# Patient Record
Sex: Female | Born: 1980 | Race: White | Hispanic: No | Marital: Married | Smoking: Former smoker
Health system: Southern US, Community
[De-identification: ages and names within clinical notes are randomized; demographics above are authoritative.]

## PROBLEM LIST (undated history)

## (undated) DIAGNOSIS — C539 Malignant neoplasm of cervix uteri, unspecified: Secondary | ICD-10-CM

## (undated) DIAGNOSIS — N809 Endometriosis, unspecified: Secondary | ICD-10-CM

## (undated) DIAGNOSIS — N83209 Unspecified ovarian cyst, unspecified side: Secondary | ICD-10-CM

## (undated) HISTORY — PX: CERVICAL BIOPSY: SHX590

## (undated) HISTORY — PX: CERVICAL CONE BIOPSY: SUR198

## (undated) HISTORY — PX: TUBAL LIGATION: SHX77

---

## 2005-05-07 ENCOUNTER — Encounter: Admission: RE | Admit: 2005-05-07 | Discharge: 2005-05-07 | Payer: Self-pay | Admitting: Occupational Medicine

## 2006-06-03 ENCOUNTER — Inpatient Hospital Stay (HOSPITAL_COMMUNITY): Admission: AD | Admit: 2006-06-03 | Discharge: 2006-06-09 | Payer: Self-pay | Admitting: Psychiatry

## 2006-06-03 ENCOUNTER — Ambulatory Visit: Payer: Self-pay | Admitting: Psychiatry

## 2006-07-24 ENCOUNTER — Emergency Department (HOSPITAL_COMMUNITY): Admission: EM | Admit: 2006-07-24 | Discharge: 2006-07-24 | Payer: Self-pay | Admitting: Emergency Medicine

## 2008-01-27 ENCOUNTER — Emergency Department (HOSPITAL_COMMUNITY): Admission: EM | Admit: 2008-01-27 | Discharge: 2008-01-27 | Payer: Self-pay | Admitting: Emergency Medicine

## 2008-08-08 ENCOUNTER — Ambulatory Visit (HOSPITAL_COMMUNITY): Admission: RE | Admit: 2008-08-08 | Discharge: 2008-08-08 | Payer: Self-pay | Admitting: Unknown Physician Specialty

## 2008-08-29 ENCOUNTER — Ambulatory Visit (HOSPITAL_COMMUNITY): Admission: RE | Admit: 2008-08-29 | Discharge: 2008-08-29 | Payer: Self-pay | Admitting: Unknown Physician Specialty

## 2008-10-12 ENCOUNTER — Ambulatory Visit (HOSPITAL_COMMUNITY): Admission: RE | Admit: 2008-10-12 | Discharge: 2008-10-12 | Payer: Self-pay | Admitting: Unknown Physician Specialty

## 2008-11-09 ENCOUNTER — Ambulatory Visit (HOSPITAL_COMMUNITY): Admission: RE | Admit: 2008-11-09 | Discharge: 2008-11-09 | Payer: Self-pay | Admitting: Unknown Physician Specialty

## 2008-12-21 ENCOUNTER — Ambulatory Visit (HOSPITAL_COMMUNITY): Admission: RE | Admit: 2008-12-21 | Discharge: 2008-12-21 | Payer: Self-pay | Admitting: Unknown Physician Specialty

## 2010-05-02 ENCOUNTER — Inpatient Hospital Stay: Payer: Self-pay | Admitting: Psychiatry

## 2010-07-29 ENCOUNTER — Emergency Department (HOSPITAL_COMMUNITY): Admission: EM | Admit: 2010-07-29 | Discharge: 2010-07-30 | Payer: Self-pay | Admitting: Emergency Medicine

## 2010-12-03 LAB — URINE CULTURE: Culture  Setup Time: 201111082126

## 2010-12-03 LAB — BASIC METABOLIC PANEL
BUN: 8 mg/dL (ref 6–23)
Chloride: 102 mEq/L (ref 96–112)
GFR calc Af Amer: >60 mL/min (ref >60)
GFR calc non Af Amer: >60 mL/min (ref >60)
Potassium: 4.3 mEq/L (ref 3.5–5.1)
Sodium: 138 mEq/L (ref 135–145)

## 2010-12-03 LAB — CBC
HCT: 43.2 % (ref 36.0–46.0)
Hemoglobin: 14.7 g/dL (ref 12.0–15.0)
MCV: 87.5 fL (ref 78.0–100.0)
RBC: 4.94 MIL/uL (ref 3.87–5.11)
WBC: 8 10*3/uL (ref 4.0–10.5)

## 2010-12-03 LAB — HEPATIC FUNCTION PANEL
ALT: 21 U/L (ref 0–35)
AST: 27 U/L (ref 0–37)
Alkaline Phosphatase: 72 U/L (ref 39–117)
Bilirubin, Direct: 0.2 mg/dL (ref 0.0–0.3)
Indirect Bilirubin: 0.7 mg/dL (ref 0.3–0.9)

## 2010-12-03 LAB — RAPID URINE DRUG SCREEN, HOSP PERFORMED
Amphetamines: NOT DETECTED
Benzodiazepines: POSITIVE — AB
Opiates: POSITIVE — AB
Tetrahydrocannabinol: POSITIVE — AB

## 2010-12-03 LAB — DIFFERENTIAL
Basophils Absolute: 0 10*3/uL (ref 0.0–0.1)
Eosinophils Relative: 3 % (ref 0–5)
Lymphocytes Relative: 24 % (ref 12–46)
Lymphs Abs: 1.9 10*3/uL (ref 0.7–4.0)
Monocytes Absolute: 0.5 10*3/uL (ref 0.1–1.0)
Neutro Abs: 5.3 10*3/uL (ref 1.7–7.7)

## 2010-12-03 LAB — URINALYSIS, ROUTINE W REFLEX MICROSCOPIC
Nitrite: NEGATIVE
Protein, ur: NEGATIVE mg/dL
Specific Gravity, Urine: >1.03 — ABNORMAL HIGH (ref 1.005–1.030)
Urobilinogen, UA: 2 mg/dL — ABNORMAL HIGH (ref 0.0–1.0)

## 2010-12-03 LAB — PREGNANCY, URINE: Preg Test, Ur: NEGATIVE

## 2010-12-03 LAB — URINE MICROSCOPIC-ADD ON

## 2010-12-03 LAB — ETHANOL: Alcohol, Ethyl (B): <5 mg/dL (ref 0–10)

## 2011-02-07 NOTE — Discharge Summary (Addendum)
NAME:  Jenny Garrison, Jenny Garrison            ACCOUNT NO.:  [id number]   MEDICAL RECORD NO.:  [id number]          PATIENT TYPE:  IPS   LOCATION:  0301                          FACILITY:  [facility]   PHYSICIAN:  Geoffery Lyons, M.D.      DATE OF BIRTH:  [birth year]   DATE OF ADMISSION:  06/03/2006  DATE OF DISCHARGE:  06/09/2006                                 DISCHARGE SUMMARY   CHIEF COMPLAINT AND HISTORY OF PRESENT ILLNESS:  It was the first admission  to [facility] for this 30 year old married female.  Petitioned by her physician at [facility].  She was admitted there  secondary May 31, 2006, because of an overdose of 8 tablets of Xanax,  which she had been taking off the street.  Also, she drank some alcohol.  She passed out and was found in the car by the police.  She had gotten some  type of traffic ticket prior to passing out.  Admitted to the ICU, had some  agitation, she received some Haldol.  Admitted taking Xanax recreationally  on the weekend, when she has been drinking.  She had similar episode six  months prior to this admission.  Admitted that she recognized that she was  using drugs and alcohol inappropriately, wanted to stay clean.   PAST PSYCHIATRIC HISTORY:  First time at [facility].  Similar episode  six months ago.  Alcohol and Xanax __________.   __________ HISTORY:  As already stated, episodes of abusing alcohol and  Xanax with episode of blackouts.   MEDICAL HISTORY:  Noncontributory.   MEDICATIONS:  1. Effexor XR 75 mg per day, has been taking for several weeks.  2. Taking Xanax 1 mg off the street.   ____ QA MARKER 151 ____PERFOR   LABORATORY DATA:  Urine pregnancy test negative.  UDS positive for  benzodiazepines.  CBC within normal limits.  Electrolytes unremarkable.  Liver enzymes within normal limits.  TSH 1.08.   PHYSICAL EXAMINATION:  GENERAL:  Reveals a fully alert female, pleasant,  cooperative, well groomed, bright affect.   Speech was normal in pace and  tone and production, pretty fluent, articulate.  MOOD:  Somewhat anxious about the future.  Endorsed being embarrassed and  ashamed of her actions.  Endorsed that she understood what she was doing was  unsafe.  She [facility] as she [facility] very well [facility].  __________  able  to maintain sobriety on the Effexor.    AXIS I:  Alcohol abuse, benzodiazepine abuse, depressive disorder, not  otherwise specified.   AXIS II:  No diagnosis.   AXIS III:  No diagnosis.   AXIS IV:  Moderate.   AXIS V:  Per admission 30, highest GAF in the last year 65-70.   HOSPITAL COURSE:  She was admitted.  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  She was maintained on the Effexor, given Ambien.  She was  given Cipro and Flagyl.  She endorsed that she was under a lot of stress  raising her 2 children as a single parent.  She works.  The father  of her  children is in jail, will be released in a few days.  He has been in and  out.  He, according to her report, does alcohol, drugs, and he has been in  jail due to drug-related crimes.  When he is not using drugs, she claims he  really is a good father, good person.  On drugs, he is unpredictable.  She,  herself, admits that she parties on the weekends using Xanax that she gets  from friends.  She got high.  She endorsed that she might have taken close  to twenty 1 mg Xanax.  She uses Effexor up to 75 mg successfully and was  wanting to go back on it.  We went ahead and pursued some detox.  We got the  Effexor back in place.  She was having a hard time with her mood, very  anxious.  Sleep was a major concern, did not sleep too well.  Hard time  taking p.o. due to having big tonsils.  She opens up the capsules or bites  the tablets.  He was thinking of the boyfriend was going to be out of jail  he but did not.  We added Remeron Soltab 30 mg at bedtime.  We continued to  her with the Effexor and the Remeron.   June 08, 2006, she was sleeping  better.  Boyfriend did not come out of jail as she expected, had to go right  back due to other charges.  She herself said she was better, committed to  abstinence.  She will not go back with her boyfriend.  She said she could  not continue to live with his substance use.  June 09, 2006, she was in  full contact with reality, committed to abstinence.  No suicidal, no  homicidal idea.  No hallucination.  No delusions.  Boyfriend was finally out  of jail but she was not planning to go back with him.   DISCHARGE DIAGNOSES:   AXIS I:  1. Depressive disorder, not otherwise specified.  2. Benzodiazepine and alcohol abuse.   AXIS II:  No diagnosis.   AXIS III:  No diagnosis.   AXIS IV:  Moderate.   AXIS V:  Upon discharge 55-60.   Discharged on:  1. Flagyl 250 twice a day for seven days.  2. Remeron Soltab 30 mg at bedtime.  3. Effexor XR 150 mg per day.  4. Protonix 40 mg per day.   Follow with [facility].      Geoffery Lyons, M.D.  Electronically Signed    IL/MEDQ  D:  06/29/2006  T:  07/01/2006  Job:  [id number]

## 2011-02-07 NOTE — H&P (Signed)
NAME:  Jenny Garrison, Jenny Garrison            ACCOUNT NO.:  [id number]   MEDICAL RECORD NO.:  [id number]          PATIENT TYPE:  IPS   LOCATION:  0301                          FACILITY:  [facility]   PHYSICIAN:  Geoffery Lyons, M.D.      DATE OF BIRTH:  Mar 09, 1981   DATE OF ADMISSION:  06/03/2006  DATE OF DISCHARGE:                         PSYCHIATRIC ADMISSION ASSESSMENT   IDENTIFYING INFORMATION:  This is a 30 year old married female.  This is an  involuntary admission.   HISTORY OF PRESENT ILLNESS:  This patient was petitioned by her physician at  [facility] after she was admitted there on May 31, 2006  after taking an overdose of approximately 8 tablets of Xanax which she has  been taking off the street.  She had also drunk some alcohol.  She passed  out and was fond in the car by police and apparently had gotten some type of  traffic ticket prior to passing out.  There was concern that it was an  intentional overdose.  The patient was admitted to the ICU where she was  stabilized.  She did not require ventilator support, did have some episodes  of agitation which were well-controlled with some Haldol.  The patient is a  68 year old mother of two small children who reports she has been using  Xanax recreationally on the weekend when she has been drinking, taking it  when she socializes with her friends because it makes her feel good.  She  had a similar episode of passing out and not remembering the circumstances  and was hospitalized at [facility] approximately six months ago.  Today, she denies any suicidal thought, is ashamed of her behavior,  recognizes that she is using drugs and alcohol inappropriately and expresses  her interest in remaining clean and sober and avoiding this.  Says that she  did very well attending Narcotics Anonymous meetings for about a month and  was completely clean and sober, then became stressed after returning to work  and feeling stressed  beyond her limits between the job and care of two small  children.  Husband is currently in prison and has a history of some  substance use also.  The patient denies any suicidal or homicidal thoughts.  Denies any IV drug abuse.   PAST PSYCHIATRIC HISTORY:  This is the patient's first admission to [facility].  As noted, she had previous episode of what  she says was essentially identical, passing out on Xanax and alcohol after  which she was hospitalized at [facility] about six months  ago.  Has attended Narcotics Anonymous and feels that this was good for her  and would like to return there.  Denies prior suicide attempts.  Denies IV  drug abuse.  Denies other substance use.  Denies history of mania.  The  patient was prescribed previously Effexor and was taking about 75 mg a day.  This did help her while she was attending NA, then she stopped taking it but  she is not sure why.  Did feel that it was helpful.   SOCIAL HISTORY:  Married female with 75 and 54-year-old sons.  Husband  currently in jail, doing 60 days for breaking and entering and has also had  some substance use problems.  Mother is supportive.  She is currently living  with her mother who is caring for the children while she is hospitalized.  No current legal charges other than traffic tickets.   FAMILY HISTORY:  Father and paternal grandfather with history of alcoholism.   MEDICAL HISTORY:  Current medical problems are status post amnestic episode  with blackout, possible drug overdose on alprazolam.  No other medical  problems.  Healthy female, 30 years old.   CURRENT MEDICATIONS:  Effexor XR 75 mg which she has lapsed taking for a  couple of weeks at least.  Did receive some low dose Haldol in the ICU for  episodes of agitation.  She is also on Flagyl 500 mg p.o. b.i.d. for vaginal  infection secondary to IUD placement and some vaginal discharge that she had  had.  Also taking  Xanax 1 mg off the street.   ALLERGIES:  No known drug allergies.   PHYSICAL EXAMINATION:  The patient's full physical examination is in the  record.  Done at [facility].  On admission to our unit, she is 5 feet  1 inch tall, 132 pounds, temperature 98, pulse 86, respirations 16, blood  pressure 150/74, O2 sat was 100%.  Well-nourished, well-developed female who  is in no acute distress, well-groomed.   LABORATORY DATA:  At the time of admission to [facility] revealed blood  alcohol less than 5, salicylate less than 4, acetaminophen level was less  than 10.  Her urine pregnancy test was negative.  Urine drug screen was  positive for benzodiazepines, negative for all other substances.  Routine  urinalysis revealed large amount of bacteria, 0-5 white cells.  CBC was all  within normal limits and electrolytes were unremarkable.  Liver enzymes were  within normal limits and TSH was 1.08.   MENTAL STATUS EXAM:  Fully alert female.  Pleasant, cooperative.  Very well-  groomed.  Bright affect.  Speech is normal in pace and tone and production,  fluent, articulate.  Mood somewhat anxious about the future.  Also  embarrassed and ashamed of her actions.  Knows that what she is doing is  unsafe and voices her commitment to stay away from drugs and alcohol.  Realizes that she did very well at [facility] and plans to go back there.  Thinks  that that will be a good support mechanism for her.  Also knows that she did  better and was able to maintain sobriety on the Effexor.  Feels that that  would help her again.  Thought processes are logical, coherent, goal  directed.  No suicidal thoughts today.  No evidence of homicidal thoughts or  psychosis.  No flight of ideas or paranoia.  Insight satisfactory.  Cognitively, she is intact and oriented x3.  Calculation and concentration  are intact.  Impulse control and judgment within normal limits.  DIAGNOSES:  AXIS I:  Benzodiazepine abuse; rule out  dependence.  ETOH abuse;  rule out dependence.  Depressive disorder not otherwise specified.  AXIS II:  No diagnosis.  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  Benzodiazepine  overdose.  AXIS IV:  Moderate (marital stress).  AXIS V:  Current 30; past year 40.   PLAN:  To involuntarily admit the patient for safe detox from alcohol and  benzodiazepines with the goal of a  safe detox in five days.  We started her  on a Librium protocol and she has clearly expressed some depression and  anxiety with parenting and the issues at home.  We are going to restart her  Effexor at 37.5 mg daily and progress from there.  Will continue her routine  medications and we have enrolled her in dual-diagnosis programming and we  hope to get a meeting with her husband if possible this week.   ESTIMATED LENGTH OF STAY:  Five days.     Margaret A. Scott, N.P.      Geoffery Lyons, M.D.  Electronically Signed   MAS/MEDQ  D:  06/04/2006  T:  06/04/2006  Job:  [id number]

## 2012-02-19 ENCOUNTER — Emergency Department (HOSPITAL_COMMUNITY)
Admission: EM | Admit: 2012-02-19 | Discharge: 2012-02-19 | Disposition: A | Discharge status: Home / Self Care | Payer: Self-pay | Attending: Emergency Medicine | Admitting: Emergency Medicine

## 2012-02-19 ENCOUNTER — Encounter (HOSPITAL_COMMUNITY): Payer: Self-pay | Admitting: *Deleted

## 2012-02-19 DIAGNOSIS — R102 Pelvic and perineal pain: Secondary | ICD-10-CM

## 2012-02-19 DIAGNOSIS — B9689 Other specified bacterial agents as the cause of diseases classified elsewhere: Secondary | ICD-10-CM | POA: Insufficient documentation

## 2012-02-19 DIAGNOSIS — F17200 Nicotine dependence, unspecified, uncomplicated: Secondary | ICD-10-CM | POA: Insufficient documentation

## 2012-02-19 DIAGNOSIS — N9489 Other specified conditions associated with female genital organs and menstrual cycle: Secondary | ICD-10-CM | POA: Insufficient documentation

## 2012-02-19 DIAGNOSIS — N760 Acute vaginitis: Secondary | ICD-10-CM | POA: Insufficient documentation

## 2012-02-19 DIAGNOSIS — A499 Bacterial infection, unspecified: Secondary | ICD-10-CM | POA: Insufficient documentation

## 2012-02-19 HISTORY — DX: Malignant neoplasm of cervix uteri, unspecified: C53.9

## 2012-02-19 HISTORY — DX: Unspecified ovarian cyst, unspecified side: N83.209

## 2012-02-19 LAB — PREGNANCY, URINE: Preg Test, Ur: NEGATIVE

## 2012-02-19 LAB — URINALYSIS, ROUTINE W REFLEX MICROSCOPIC
Bilirubin Urine: NEGATIVE
Hgb urine dipstick: NEGATIVE
Ketones, ur: NEGATIVE mg/dL
Nitrite: NEGATIVE
Specific Gravity, Urine: 1.015 (ref 1.005–1.030)
Urobilinogen, UA: 1 mg/dL (ref 0.0–1.0)
pH: 8.5 — ABNORMAL HIGH (ref 5.0–8.0)

## 2012-02-19 LAB — WET PREP, GENITAL: Yeast Wet Prep HPF POC: NONE SEEN

## 2012-02-19 LAB — POCT PREGNANCY, URINE: Preg Test, Ur: NEGATIVE

## 2012-02-19 MED ORDER — METRONIDAZOLE 500 MG PO TABS
2000.0000 mg | ORAL_TABLET | Freq: Once | ORAL | Status: AC
  Administered 2012-02-19: 2000 mg via ORAL
  Filled 2012-02-19: qty 4

## 2012-02-19 MED ORDER — NAPROXEN 500 MG PO TABS: 500.0000 mg | ORAL_TABLET | Freq: Two times a day (BID) | ORAL | Status: AC

## 2012-02-19 MED ORDER — HYDROMORPHONE HCL PF 1 MG/ML IJ SOLN
1.0000 mg | Freq: Once | INTRAMUSCULAR | Status: AC
  Administered 2012-02-19: 1 mg via INTRAMUSCULAR
  Filled 2012-02-19: qty 1

## 2012-02-19 MED ORDER — KETOROLAC TROMETHAMINE 60 MG/2ML IM SOLN
60.0000 mg | Freq: Once | INTRAMUSCULAR | Status: AC
  Administered 2012-02-19: 60 mg via INTRAMUSCULAR
  Filled 2012-02-19: qty 2

## 2012-02-19 MED ORDER — HYDROCODONE-ACETAMINOPHEN 5-325 MG PO TABS: 2.0000 | ORAL_TABLET | ORAL | Status: AC | PRN

## 2012-02-19 NOTE — ED Provider Notes (Signed)
History    This chart was scribed for Jenny Octave, MD, MD by Smitty Pluck. The patient was seen in room APA12 and the patient's care was started at 9:03PM.   CSN: [id number]  Arrival date & time 02/19/12  4098   First MD Initiated Contact with Patient 02/19/12 2101      Chief Complaint  Patient presents with  . Pelvic Pain  . Dysuria  . Urinary Frequency    (Consider location/radiation/quality/duration/timing/severity/associated sxs/prior treatment) The history is provided by the patient.   Jenny Garrison is a 31 y.o. female who presents to the Emergency Department complaining of moderate dysuria and urinary frequency onset today. Denies hematuria, fever, Pt reports having chronic pelvic pain due to having cyst on ovaries. She reports history of cervical cancer. Reports that her period is abnormal and the last one she had was 1.5 months ago. Symptoms have been constant since onset with radiation to lower back.   Past Medical History  Diagnosis Date  . Other and unspecified ovarian cysts rt side  . CA cervix     Past Surgical History  Procedure Date  . Tubal ligation     History reviewed. No pertinent family history.  History  Substance Use Topics  . Smoking status: Current Everyday Smoker -- 0.5 packs/day for 13 years    Types: Cigarettes  . Smokeless tobacco: Not on file  . Alcohol Use: No    OB History    Grav Para Term Preterm Abortions TAB SAB Ect Mult Living                  Review of Systems  All other systems reviewed and are negative.  10 Systems reviewed and all are negative for acute change except as noted in the HPI.    Allergies  Review of patient's allergies indicates no known allergies.  Home Medications   Current Outpatient Rx  Name Route Sig Dispense Refill  . IBUPROFEN 200 MG PO TABS Oral Take 200 mg by mouth every 6 (six) hours as needed. For pain    . HYDROCODONE-ACETAMINOPHEN 5-325 MG PO TABS Oral Take 2 tablets by mouth every 4  (four) hours as needed for pain. 10 tablet 0  . NAPROXEN 500 MG PO TABS Oral Take 1 tablet (500 mg total) by mouth 2 (two) times daily. 30 tablet 0    BP 114/61  Pulse 75  Temp(Src) 98.1 F (36.7 C) (Oral)  Resp 20  Ht 5\' 2"  (1.575 m)  Wt 140 lb (63.504 kg)  BMI 25.61 kg/m2  SpO2 97%  Physical Exam  Nursing note and vitals reviewed. Constitutional: She is oriented to person, place, and time. She appears well-developed and well-nourished.  HENT:  Head: Normocephalic and atraumatic.  Neck: Normal range of motion. Neck supple.  Cardiovascular: Normal rate, regular rhythm and normal heart sounds.   Pulmonary/Chest: Effort normal and breath sounds normal. No respiratory distress.  Abdominal: There is tenderness (mild LLQ, RLQ, suprapubic). There is no rebound and no guarding.  Genitourinary: There is no tenderness on the right labia. There is no tenderness on the left labia. Cervix exhibits no motion tenderness and no discharge. Right adnexum displays tenderness. Left adnexum displays tenderness. No vaginal discharge found.  Neurological: She is alert and oriented to person, place, and time.  Skin: Skin is warm and dry.  Psychiatric: She has a normal mood and affect. Her behavior is normal.    ED Course  Procedures (including critical care time) DIAGNOSTIC STUDIES:  Oxygen Saturation is 100% on room air, normal by my interpretation.    COORDINATION OF CARE: 9:05PM EDP discusses pt ED treatment with pt.  9:24PM EDP orders medication: toradol 60 mg, dilaudid 1 mg   Labs Reviewed  URINALYSIS, ROUTINE W REFLEX MICROSCOPIC - Abnormal; Notable for the following:    APPearance CLOUDY (*)    pH 8.5 (*)    All other components within normal limits  WET PREP, GENITAL - Abnormal; Notable for the following:    Clue Cells Wet Prep HPF POC FEW (*)    WBC, Wet Prep HPF POC FEW (*)    All other components within normal limits  PREGNANCY, URINE  POCT PREGNANCY, URINE  GC/CHLAMYDIA PROBE  AMP, GENITAL   No results found.   1. Pelvic pain   2. Bacterial vaginosis       MDM  History of ovarian cysts, chronic pelvic pain which is unchanged, now with burning with urination and pressure.  No vaginal bleeding or discharge.  Has chronic pelvic pain managed by her GYN in [place].  Pelvic exam benign.  Treat BV.  Short course of NSAIDs and pain medication.  Follow up with GYN.  I personally performed the services described in this documentation, which was scribed in my presence.  The recorded information has been reviewed and considered.        Jenny Octave, MD 02/19/12 [id number]

## 2012-02-19 NOTE — Discharge Instructions (Signed)
Bacterial Vaginosis Bacterial vaginosis (BV) is a vaginal infection where the normal balance of bacteria in the vagina is disrupted. The normal balance is then replaced by an overgrowth of certain bacteria. There are several different kinds of bacteria that can cause BV. BV is the most common vaginal infection in women of childbearing age. CAUSES   The cause of BV is not fully understood. BV develops when there is an increase or imbalance of harmful bacteria.   Some activities or behaviors can upset the normal balance of bacteria in the vagina and put women at increased risk including:   Having a new sex partner or multiple sex partners.   Douching.   Using an intrauterine device (IUD) for contraception.   It is not clear what role sexual activity plays in the development of BV. However, women that have never had sexual intercourse are rarely infected with BV.  Women do not get BV from toilet seats, bedding, swimming pools or from touching objects around them.  SYMPTOMS   Grey vaginal discharge.   A fish-like odor with discharge, especially after sexual intercourse.   Itching or burning of the vagina and vulva.   Burning or pain with urination.   Some women have no signs or symptoms at all.  DIAGNOSIS  Your caregiver must examine the vagina for signs of BV. Your caregiver will perform lab tests and look at the sample of vaginal fluid through a microscope. They will look for bacteria and abnormal cells (clue cells), a pH test higher than 4.5, and a positive amine test all associated with BV.  RISKS AND COMPLICATIONS   Pelvic inflammatory disease (PID).   Infections following gynecology surgery.   Developing HIV.   Developing herpes virus.  TREATMENT  Sometimes BV will clear up without treatment. However, all women with symptoms of BV should be treated to avoid complications, especially if gynecology surgery is planned. Female partners generally do not need to be treated. However,  BV may spread between female sex partners so treatment is helpful in preventing a recurrence of BV.   BV may be treated with antibiotics. The antibiotics come in either pill or vaginal cream forms. Either can be used with nonpregnant or pregnant women, but the recommended dosages differ. These antibiotics are not harmful to the baby.   BV can recur after treatment. If this happens, a second round of antibiotics will often be prescribed.   Treatment is important for pregnant women. If not treated, BV can cause a premature delivery, especially for a pregnant woman who had a premature birth in the past. All pregnant women who have symptoms of BV should be checked and treated.   For chronic reoccurrence of BV, treatment with a type of prescribed gel vaginally twice a week is helpful.  HOME CARE INSTRUCTIONS   Finish all medication as directed by your caregiver.   Do not have sex until treatment is completed.   Tell your sexual partner that you have a vaginal infection. They should see their caregiver and be treated if they have problems, such as a mild rash or itching.   Practice safe sex. Use condoms. Only have 1 sex partner.  PREVENTION  Basic prevention steps can help reduce the risk of upsetting the natural balance of bacteria in the vagina and developing BV:  Do not have sexual intercourse (be abstinent).   Do not douche.   Use all of the medicine prescribed for treatment of BV, even if the signs and symptoms go away.     Tell your sex partner if you have BV. That way, they can be treated, if needed, to prevent reoccurrence.  SEEK MEDICAL CARE IF:   Your symptoms are not improving after 3 days of treatment.   You have increased discharge, pain, or fever.  MAKE SURE YOU:   Understand these instructions.   Will watch your condition.   Will get help right away if you are not doing well or get worse.  FOR MORE INFORMATION  Division of STD Prevention (DSTDP), Centers for Disease  Control and Prevention: [employer]Apps.[employer] American Social Health Association (ASHA): www.ashastd.org  Document Released: 09/08/2005 Document Revised: 08/28/2011 Document Reviewed: 03/01/2009 [facility] Patient Information 2012 [place], [place].Pelvic Pain Pelvic pain is pain below the belly button and located between your hips. Acute pain may last a few hours or days. Chronic pelvic pain may last weeks and months. The cause may be different for different types of pain. The pain may be dull or sharp, mild or severe and can interfere with your daily activities. Write down and tell your caregiver:   Exactly where the pain is located.   If it comes and goes or is there all the time.   When it happens (with sex, urination, bowel movement, etc.)   If the pain is related to your menstrual period or stress.  Your caregiver will take a full history and do a complete physical exam and Pap test. CAUSES   Painful menstrual periods (dysmenorrhea).   Normal ovulation (Mittelschmertz) that occurs in the middle of the menstrual cycle every month.   The pelvic organs get engorged with blood just before the menstrual period (pelvic congestive syndrome).   Scar tissue from an infection or past surgery (pelvic adhesions).   Cancer of the female pelvic organs. When there is pain with cancer, it has been there for a long time.   The lining of the uterus (endometrium) abnormally grows in places like the pelvis and on the pelvic organs (endometriosis).   A form of endometriosis with the lining of the uterus present inside of the muscle tissue of the uterus (adenomyosis).   Fibroid tumor (noncancerous) in the uterus.   Bladder problems such as infection, bladder spasms of the muscle tissue of the bladder.   Intestinal problems (irritable bowel syndrome, colitis, an ulcer or gastrointestinal infection).   Polyps of the cervix or uterus.   Pregnancy in the tube (ectopic pregnancy).   The opening of the  cervix is too small for the menstrual blood to flow through it (cervical stenosis).   Physical or sexual abuse (past or present).   Musculo-skeletal problems from poor posture, problems with the vertebrae of the lower back or the uterine pelvic muscles falling (prolapse).   Psychological problems such as depression or stress.   IUD (intrauterine device) in the uterus.  DIAGNOSIS  Tests to make a diagnosis depends on the type, location, severity and what causes the pain to occur. Tests that may be needed include:  Blood tests.   Urine tests   Ultrasound.   X-rays.   CT Scan.   MRI.   Laparoscopy.   Major surgery.  TREATMENT  Treatment will depend on the cause of the pain, which includes:  Prescription or over-the-counter pain medication.   Antibiotics.   Birth control pills.   Hormone treatment.   Nerve blocking injections.   Physical therapy.   Antidepressants.   Counseling with a psychiatrist or psychologist.   Minor or major surgery.  HOME CARE INSTRUCTIONS   Only  take over-the-counter or prescription medicines for pain, discomfort or fever as directed by your caregiver.   Follow your caregiver's advice to treat your pain.   Rest.   Avoid sexual intercourse if it causes the pain.   Apply warm or cold compresses (which ever works best) to the pain area.   Do relaxation exercises such as yoga or meditation.   Try acupuncture.   Avoid stressful situations.   Try group therapy.   If the pain is because of a stomach/intestinal upset, drink clear liquids, eat a bland light food diet until the symptoms go away.  SEEK MEDICAL CARE IF:   You need stronger prescription pain medication.   You develop pain with sexual intercourse.   You have pain with urination.   You develop a temperature of 102 F (38.9 C) with the pain.   You are still in pain after 4 hours of taking prescription medication for the pain.   You need depression medication.    Your IUD is causing pain and you want it removed.  SEEK IMMEDIATE MEDICAL CARE IF:  You develop very severe pain or tenderness.   You faint, have chills, severe weakness or dehydration.   You develop heavy vaginal bleeding or passing solid tissue.   You develop a temperature of 102 F (38.9 C) with the pain.   You have blood in the urine.   You are being physically or sexually abused.   You have uncontrolled vomiting and diarrhea.   You are depressed and afraid of harming yourself or someone else.  Document Released: 10/16/2004 Document Revised: 08/28/2011 Document Reviewed: 07/13/2008 [facility] Patient Information 2012 [place], [place].

## 2012-02-19 NOTE — ED Notes (Signed)
Pt has HX of ovarian cysts, cervical cancer - Pt states the pain "feels like it is in my ovaries". Pt also stated having frequent painful urination that just started along with the pelvic pain".

## 2012-02-21 LAB — GC/CHLAMYDIA PROBE AMP, GENITAL: Chlamydia, DNA Probe: NEGATIVE

## 2012-05-31 ENCOUNTER — Ambulatory Visit (HOSPITAL_COMMUNITY): Payer: Self-pay | Admitting: Physical Therapy

## 2013-02-24 ENCOUNTER — Emergency Department (HOSPITAL_COMMUNITY)
Admission: EM | Admit: 2013-02-24 | Discharge: 2013-02-24 | Disposition: A | Discharge status: Home / Self Care | Payer: Self-pay | Attending: Emergency Medicine | Admitting: Emergency Medicine

## 2013-02-24 ENCOUNTER — Encounter (HOSPITAL_COMMUNITY): Payer: Self-pay | Admitting: Emergency Medicine

## 2013-02-24 DIAGNOSIS — K089 Disorder of teeth and supporting structures, unspecified: Secondary | ICD-10-CM | POA: Insufficient documentation

## 2013-02-24 DIAGNOSIS — R51 Headache: Secondary | ICD-10-CM | POA: Insufficient documentation

## 2013-02-24 DIAGNOSIS — R220 Localized swelling, mass and lump, head: Secondary | ICD-10-CM | POA: Insufficient documentation

## 2013-02-24 DIAGNOSIS — Z8742 Personal history of other diseases of the female genital tract: Secondary | ICD-10-CM | POA: Insufficient documentation

## 2013-02-24 DIAGNOSIS — Z8541 Personal history of malignant neoplasm of cervix uteri: Secondary | ICD-10-CM | POA: Insufficient documentation

## 2013-02-24 DIAGNOSIS — F17200 Nicotine dependence, unspecified, uncomplicated: Secondary | ICD-10-CM | POA: Insufficient documentation

## 2013-02-24 DIAGNOSIS — K0889 Other specified disorders of teeth and supporting structures: Secondary | ICD-10-CM

## 2013-02-24 HISTORY — DX: Endometriosis, unspecified: N80.9

## 2013-02-24 MED ORDER — AMOXICILLIN 500 MG PO CAPS: 500.0000 mg | ORAL_CAPSULE | Freq: Three times a day (TID) | ORAL | Status: DC

## 2013-02-24 MED ORDER — HYDROCODONE-ACETAMINOPHEN 5-325 MG PO TABS
1.0000 | ORAL_TABLET | Freq: Once | ORAL | Status: AC
  Administered 2013-02-24: 1 via ORAL
  Filled 2013-02-24: qty 1

## 2013-02-24 MED ORDER — HYDROCODONE-ACETAMINOPHEN 5-325 MG PO TABS: ORAL_TABLET | ORAL | Status: DC

## 2013-02-24 MED ORDER — AMOXICILLIN 250 MG PO CAPS
500.0000 mg | ORAL_CAPSULE | Freq: Once | ORAL | Status: AC
  Administered 2013-02-24: 500 mg via ORAL
  Filled 2013-02-24: qty 2

## 2013-02-24 NOTE — ED Provider Notes (Signed)
History     CSN: [id number]  Arrival date & time 02/24/13  1540   First MD Initiated Contact with Patient 02/24/13 1651      Chief Complaint  Patient presents with  . Dental Pain    (Consider location/radiation/quality/duration/timing/severity/associated sxs/prior treatment) Patient is a 32 y.o. female presenting with tooth pain. The history is provided by the patient.  Dental Pain Location:  Lower Lower teeth location:  20/LL 2nd bicuspid and 21/LL 1st bicuspid Quality:  Aching Severity:  Moderate Onset quality:  Gradual Timing:  Constant Progression:  Worsening Chronicity:  New Context: abscess and poor dentition   Context: not trauma   Relieved by:  Nothing Worsened by:  Jaw movement, cold food/drink and hot food/drink Ineffective treatments:  None tried Associated symptoms: facial pain and facial swelling   Associated symptoms: no congestion, no difficulty swallowing, no fever, no gum swelling, no headaches, no neck pain, no neck swelling and no trismus   Risk factors: periodontal disease and smoking     Past Medical History  Diagnosis Date  . Other and unspecified ovarian cysts rt side  . CA cervix   . Endometriosis     Past Surgical History  Procedure Laterality Date  . Tubal ligation    . Cervical biopsy      History reviewed. No pertinent family history.  History  Substance Use Topics  . Smoking status: Current Every Day Smoker -- 0.50 packs/day for 13 years    Types: Cigarettes  . Smokeless tobacco: Never Used  . Alcohol Use: No    OB History   Grav Para Term Preterm Abortions TAB SAB Ect Mult Living   3 3 3       3       Review of Systems  Constitutional: Negative for fever, activity change and appetite change.  HENT: Positive for facial swelling and dental problem. Negative for congestion, sore throat, trouble swallowing, neck pain and neck stiffness.   Eyes: Negative for pain and visual disturbance.  Gastrointestinal: Negative for nausea,  vomiting and abdominal pain.  Skin: Negative for rash.  Neurological: Negative for dizziness, facial asymmetry and headaches.  Hematological: Negative for adenopathy.  All other systems reviewed and are negative.    Allergies  Review of patient's allergies indicates no known allergies.  Home Medications   Current Outpatient Rx  Name  Route  Sig  Dispense  Refill  . ibuprofen (ADVIL,MOTRIN) 200 MG tablet   Oral   Take 200 mg by mouth every 6 (six) hours as needed. For pain           BP 119/75  Pulse 76  Temp(Src) 99.2 F (37.3 C) (Oral)  Resp 17  Ht 5' (1.524 m)  Wt 130 lb (58.968 kg)  BMI 25.39 kg/m2  SpO2 100%  LMP 02/23/2013  Physical Exam  Nursing note and vitals reviewed. Constitutional: She is oriented to person, place, and time. She appears well-developed and well-nourished. No distress.  HENT:  Head: Normocephalic and atraumatic. No trismus in the jaw.  Right Ear: Tympanic membrane and ear canal normal.  Left Ear: Tympanic membrane and ear canal normal.  Mouth/Throat: Uvula is midline, oropharynx is clear and moist and mucous membranes are normal. Dental caries present. No dental abscesses or edematous.    Multiple dental caries.  Localized lower facial edema.  No obvious abscessof the gums.  No trismus or sublingual abnml.  Neck: Normal range of motion. Neck supple.  Cardiovascular: Normal rate, regular rhythm, normal heart sounds  and intact distal pulses.   No murmur heard. Pulmonary/Chest: Effort normal and breath sounds normal. No respiratory distress.  Musculoskeletal: Normal range of motion.  Lymphadenopathy:    She has no cervical adenopathy.  Neurological: She is alert and oriented to person, place, and time. She exhibits normal muscle tone. Coordination normal.  Skin: Skin is warm and dry.    ED Course  Procedures (including critical care time)  Labs Reviewed - No data to display No results found.      MDM    Patient has a early  developing dental abscess.  Airway is patent, no Ludwig angina.  Agrees to abx, pain medication and given referral info for Dr. Lawrence Marseilles.      Alexis Mizuno L. Trisha Mangle, PA-C 02/24/13 1736

## 2013-02-24 NOTE — ED Notes (Signed)
Patient c/o right dental pain with swelling and pain radiating into jaw. Patient also reports headache. Reports taking tylenol and ibuprofen with no relief.

## 2013-02-25 NOTE — ED Provider Notes (Signed)
Medical screening examination/treatment/procedure(s) were performed by non-physician practitioner and as supervising physician I was immediately available for consultation/collaboration.    Jozette Castrellon M Jayshaun Phillips, DO 02/25/13 1452 

## 2014-07-24 ENCOUNTER — Encounter (HOSPITAL_COMMUNITY): Payer: Self-pay | Admitting: Emergency Medicine

## 2014-08-18 ENCOUNTER — Emergency Department (HOSPITAL_COMMUNITY)
Admission: EM | Admit: 2014-08-18 | Discharge: 2014-08-19 | Disposition: A | Discharge status: Home / Self Care | Payer: Managed Care, Other (non HMO) | Attending: Emergency Medicine | Admitting: Emergency Medicine

## 2014-08-18 ENCOUNTER — Encounter (HOSPITAL_COMMUNITY): Payer: Self-pay | Admitting: *Deleted

## 2014-08-18 DIAGNOSIS — Z8541 Personal history of malignant neoplasm of cervix uteri: Secondary | ICD-10-CM | POA: Insufficient documentation

## 2014-08-18 DIAGNOSIS — Z720 Tobacco use: Secondary | ICD-10-CM | POA: Insufficient documentation

## 2014-08-18 DIAGNOSIS — Z9889 Other specified postprocedural states: Secondary | ICD-10-CM | POA: Insufficient documentation

## 2014-08-18 DIAGNOSIS — G8929 Other chronic pain: Secondary | ICD-10-CM

## 2014-08-18 DIAGNOSIS — R102 Pelvic and perineal pain: Secondary | ICD-10-CM | POA: Insufficient documentation

## 2014-08-18 HISTORY — DX: Malignant neoplasm of cervix uteri, unspecified: C53.9

## 2014-08-18 HISTORY — DX: Unspecified ovarian cyst, unspecified side: N83.209

## 2014-08-18 LAB — URINALYSIS, ROUTINE W REFLEX MICROSCOPIC
Bilirubin Urine: NEGATIVE
Glucose, UA: NEGATIVE mg/dL
Hgb urine dipstick: NEGATIVE
KETONES UR: NEGATIVE mg/dL
LEUKOCYTES UA: NEGATIVE
Nitrite: NEGATIVE
PH: 5.5 (ref 5.0–8.0)
PROTEIN: NEGATIVE mg/dL
Specific Gravity, Urine: 1.02 (ref 1.005–1.030)
Urobilinogen, UA: 0.2 mg/dL (ref 0.0–1.0)

## 2014-08-18 LAB — PREGNANCY, URINE: Preg Test, Ur: NEGATIVE

## 2014-08-18 MED ORDER — KETOROLAC TROMETHAMINE 10 MG PO TABS
10.0000 mg | ORAL_TABLET | Freq: Once | ORAL | Status: AC
  Administered 2014-08-19: 10 mg via ORAL
  Filled 2014-08-18: qty 1

## 2014-08-18 MED ORDER — HYDROCODONE-ACETAMINOPHEN 5-325 MG PO TABS
2.0000 | ORAL_TABLET | Freq: Once | ORAL | Status: AC
  Administered 2014-08-19: 2 via ORAL
  Filled 2014-08-18: qty 2

## 2014-08-18 MED ORDER — ONDANSETRON HCL 4 MG PO TABS
4.0000 mg | ORAL_TABLET | Freq: Once | ORAL | Status: AC
  Administered 2014-08-19: 4 mg via ORAL
  Filled 2014-08-18: qty 1

## 2014-08-18 NOTE — ED Provider Notes (Signed)
CSN: [id number]     Arrival date & time 08/18/14  2200 History   First MD Initiated Contact with Patient 08/18/14 2304     Chief Complaint  Patient presents with  . Pelvic Pain     (Consider location/radiation/quality/duration/timing/severity/associated sxs/prior Treatment) HPI Comments: Patient is a 33 year old female who presents to the emergency department with a complaint of pelvic area pain. The patient states that she has had this problem for approximately 5 years. She's been diagnosed with cyst on the ovaries and endometriosis. She has been treated for cervical cancer. Pt's significant other states they cannot have sex because of the pain. This has been a problem for several months. She states that she has been going to several emergency room. She is made an appointment and is scheduled to see Dr. Elonda Husky next week. She presents to the emergency room tonight because she says the pain was just not bearable any longer. She has not had any fever. She's not had any unusual vaginal bleeding. She's not had any urine symptoms. There's been no vomiting reported. No trauma to the area. She has tried over-the-counter medications without any success.  Patient is a 33 y.o. female presenting with pelvic pain. The history is provided by the patient.  Pelvic Pain This is a chronic problem. Pertinent negatives include no abdominal pain, arthralgias, chest pain, coughing or neck pain.    Past Medical History  Diagnosis Date  . Ovarian cyst   . Cervical cancer   . Endometriosis    Past Surgical History  Procedure Laterality Date  . Cervical cone biopsy     History reviewed. No pertinent family history. History  Substance Use Topics  . Smoking status: Current Some Day Smoker  . Smokeless tobacco: Not on file  . Alcohol Use: No   OB History    No data available     Review of Systems  Constitutional: Negative for activity change.       All ROS Neg except as noted in HPI  HENT: Negative for  nosebleeds.   Eyes: Negative for photophobia and discharge.  Respiratory: Negative for cough, shortness of breath and wheezing.   Cardiovascular: Negative for chest pain and palpitations.  Gastrointestinal: Negative for abdominal pain and blood in stool.  Genitourinary: Positive for pelvic pain and dyspareunia. Negative for dysuria, urgency, frequency, hematuria, flank pain, decreased urine volume, vaginal bleeding, vaginal discharge, difficulty urinating and genital sores.  Musculoskeletal: Negative for back pain, arthralgias and neck pain.  Skin: Negative.   Neurological: Negative for dizziness, seizures and speech difficulty.  Psychiatric/Behavioral: Negative for hallucinations and confusion.      Allergies  Review of patient's allergies indicates no known allergies.  Home Medications   Prior to Admission medications   Not on File   BP 117/79 mmHg  Pulse 71  Temp(Src) 97.8 F (36.6 C) (Oral)  Resp 18  Ht 5\' 1"  (1.549 m)  Wt 130 lb (58.968 kg)  BMI 24.58 kg/m2  SpO2 99% Physical Exam  Constitutional: She is oriented to person, place, and time. She appears well-developed and well-nourished.  Non-toxic appearance.  HENT:  Head: Normocephalic.  Right Ear: Tympanic membrane and external ear normal.  Left Ear: Tympanic membrane and external ear normal.  Eyes: EOM and lids are normal. Pupils are equal, round, and reactive to light.  Neck: Normal range of motion. Neck supple. Carotid bruit is not present.  Cardiovascular: Normal rate, regular rhythm, normal heart sounds, intact distal pulses and normal pulses.   Pulmonary/Chest:  Breath sounds normal. No respiratory distress.  Abdominal: Soft. Bowel sounds are normal. There is no tenderness. There is no guarding.  No CVA tenderness. Some suprapubic area tenderness to palpation. There is pain to palpation of the left inguinal area, but no palpable lymph nodes appreciated.  Musculoskeletal: Normal range of motion.   Lymphadenopathy:       Head (right side): No submandibular adenopathy present.       Head (left side): No submandibular adenopathy present.    She has no cervical adenopathy.  Neurological: She is alert and oriented to person, place, and time. She has normal strength. No cranial nerve deficit or sensory deficit.  Skin: Skin is warm and dry.  Psychiatric: She has a normal mood and affect. Her speech is normal.  Nursing note and vitals reviewed.   ED Course  Procedures (including critical care time) Labs Review Labs Reviewed  URINALYSIS, ROUTINE W REFLEX MICROSCOPIC  PREGNANCY, URINE    Imaging Review No results found.   EKG Interpretation None      MDM  Vital signs are well within normal limits. Patient has had this pain for the past 5 years. Tonight the pain was more unbearable than usual. No injury or trauma reported. Patient states she has an appointment with the GYN physician next week.  The abdomen is so left with good bowel sounds. There is no CVA tenderness. No unusual vaginal discharge or bleeding. Urine analysis is negative for any acute changes, urine pregnancy test is negative.  Suspect the patient is having an exacerbation of her chronic pelvic pain. The patient will be treated with diclofenac and Norco. Patient is to see Dr. Elonda Husky next week as scheduled.    Final diagnoses:  None    **I have reviewed nursing notes, vital signs, and all appropriate lab and imaging results for this patient.Lenox Ahr, PA-C 08/19/14 [place], PA-C 77/41/28 [id number]  Delora Fuel, MD 67/20/94 [id number]

## 2014-08-18 NOTE — ED Notes (Signed)
Pt states she is having sharp pain in her pelvic region. Pt states this is an ongoing problem that she has cysts on her ovaries as well as endometriosis. Pt states she has an appt with Dr. Elonda Husky next week.

## 2014-08-19 MED ORDER — DICLOFENAC SODIUM 75 MG PO TBEC: 75.0000 mg | DELAYED_RELEASE_TABLET | Freq: Two times a day (BID) | ORAL | Status: DC

## 2014-08-19 MED ORDER — HYDROCODONE-ACETAMINOPHEN 5-325 MG PO TABS: 1.0000 | ORAL_TABLET | ORAL | Status: DC | PRN

## 2014-08-25 ENCOUNTER — Other Ambulatory Visit: Payer: Self-pay | Admitting: Obstetrics & Gynecology

## 2014-08-25 ENCOUNTER — Encounter: Payer: Self-pay | Admitting: Obstetrics & Gynecology

## 2014-09-25 ENCOUNTER — Encounter (HOSPITAL_COMMUNITY): Payer: Self-pay | Admitting: Emergency Medicine

## 2015-08-04 ENCOUNTER — Emergency Department (HOSPITAL_COMMUNITY)
Admission: EM | Admit: 2015-08-04 | Discharge: 2015-08-04 | Disposition: A | Discharge status: Home / Self Care | Payer: Medicaid Other | Attending: Emergency Medicine | Admitting: Emergency Medicine

## 2015-08-04 ENCOUNTER — Emergency Department (HOSPITAL_COMMUNITY): Payer: Medicaid Other

## 2015-08-04 ENCOUNTER — Encounter (HOSPITAL_COMMUNITY): Payer: Self-pay | Admitting: *Deleted

## 2015-08-04 DIAGNOSIS — F17200 Nicotine dependence, unspecified, uncomplicated: Secondary | ICD-10-CM | POA: Insufficient documentation

## 2015-08-04 DIAGNOSIS — J40 Bronchitis, not specified as acute or chronic: Secondary | ICD-10-CM | POA: Insufficient documentation

## 2015-08-04 DIAGNOSIS — Z8541 Personal history of malignant neoplasm of cervix uteri: Secondary | ICD-10-CM | POA: Insufficient documentation

## 2015-08-04 DIAGNOSIS — Z791 Long term (current) use of non-steroidal anti-inflammatories (NSAID): Secondary | ICD-10-CM | POA: Insufficient documentation

## 2015-08-04 DIAGNOSIS — Z8742 Personal history of other diseases of the female genital tract: Secondary | ICD-10-CM | POA: Insufficient documentation

## 2015-08-04 MED ORDER — HYDROCODONE-HOMATROPINE 5-1.5 MG/5ML PO SYRP: 5.0000 mL | ORAL_SOLUTION | Freq: Four times a day (QID) | ORAL | Status: DC | PRN

## 2015-08-04 MED ORDER — IBUPROFEN 800 MG PO TABS
800.0000 mg | ORAL_TABLET | Freq: Once | ORAL | Status: AC
  Administered 2015-08-04: 800 mg via ORAL
  Filled 2015-08-04: qty 1

## 2015-08-04 MED ORDER — ONDANSETRON HCL 4 MG PO TABS
4.0000 mg | ORAL_TABLET | Freq: Once | ORAL | Status: AC
  Administered 2015-08-04: 4 mg via ORAL
  Filled 2015-08-04: qty 1

## 2015-08-04 MED ORDER — PREDNISONE 50 MG PO TABS
60.0000 mg | ORAL_TABLET | Freq: Once | ORAL | Status: AC
  Administered 2015-08-04: 60 mg via ORAL
  Filled 2015-08-04 (×2): qty 1

## 2015-08-04 MED ORDER — PREDNISONE 10 MG PO TABS: 20.0000 mg | ORAL_TABLET | Freq: Every day | ORAL | Status: DC

## 2015-08-04 MED ORDER — HYDROCOD POLST-CPM POLST ER 10-8 MG/5ML PO SUER
5.0000 mL | Freq: Once | ORAL | Status: AC
  Administered 2015-08-04: 5 mL via ORAL
  Filled 2015-08-04: qty 5

## 2015-08-04 MED ORDER — ALBUTEROL SULFATE HFA 108 (90 BASE) MCG/ACT IN AERS
2.0000 | INHALATION_SPRAY | Freq: Once | RESPIRATORY_TRACT | Status: AC
Start: 2015-08-04 — End: 2015-08-04
  Administered 2015-08-04: 2 via RESPIRATORY_TRACT
  Filled 2015-08-04: qty 6.7

## 2015-08-04 NOTE — ED Notes (Addendum)
Pt c/o cough and congestion x 2 weeks. Pt states it hurts to take a deep breath.

## 2015-08-04 NOTE — ED Provider Notes (Signed)
CSN: [id number]     Arrival date & time 08/04/15  2035 History   First MD Initiated Contact with Patient 08/04/15 2104     Chief Complaint  Patient presents with  . Cough     (Consider location/radiation/quality/duration/timing/severity/associated sxs/prior Treatment) Patient is a 34 y.o. female presenting with cough. The history is provided by the patient.  Cough Cough characteristics:  Non-productive Severity:  Moderate Onset quality:  Gradual Duration:  2 weeks Timing:  Intermittent Progression:  Worsening Chronicity:  New Smoker: yes   Context: upper respiratory infection and weather changes   Context: not sick contacts   Relieved by:  Nothing Worsened by:  Deep breathing Ineffective treatments:  Cough suppressants Associated symptoms: rhinorrhea, shortness of breath, sinus congestion and wheezing   Associated symptoms: no chills and no fever   Risk factors: no recent travel     Past Medical History  Diagnosis Date  . Other and unspecified ovarian cysts rt side  . CA cervix ([place])   . Ovarian cyst   . Cervical cancer ([place])   . Endometriosis    Past Surgical History  Procedure Laterality Date  . Tubal ligation    . Cervical biopsy    . Cervical cone biopsy     History reviewed. No pertinent family history. Social History  Substance Use Topics  . Smoking status: Current Some Day Smoker  . Smokeless tobacco: None  . Alcohol Use: No   OB History    Gravida Para Term Preterm AB TAB SAB Ectopic Multiple Living   3 3 3  0 0 0 0 0       Review of Systems  Constitutional: Negative for fever and chills.  HENT: Positive for rhinorrhea.   Respiratory: Positive for cough, shortness of breath and wheezing.   All other systems reviewed and are negative.     Allergies  Review of patient's allergies indicates no known allergies.  Home Medications   Prior to Admission medications   Medication Sig Start Date End Date Taking? Authorizing Provider  amoxicillin  (AMOXIL) 500 MG capsule Take 1 capsule (500 mg total) by mouth 3 (three) times daily. For 10 days 02/24/13   Tammy Triplett, PA-C  diclofenac (VOLTAREN) 75 MG EC tablet Take 1 tablet (75 mg total) by mouth 2 (two) times daily. 08/19/14   Lily Kocher, PA-C  HYDROcodone-acetaminophen (NORCO/VICODIN) 5-325 MG per tablet Take one-two tabs po q 4-6 hrs prn pain 02/24/13   Tammy Triplett, PA-C  HYDROcodone-acetaminophen (NORCO/VICODIN) 5-325 MG per tablet Take 1 tablet by mouth every 4 (four) hours as needed. 08/19/14   Lily Kocher, PA-C  ibuprofen (ADVIL,MOTRIN) 200 MG tablet Take 200 mg by mouth every 6 (six) hours as needed. For pain    Historical Provider, MD   BP 115/62 mmHg  Pulse 88  Temp(Src) 98.1 F (36.7 C) (Oral)  Resp 20  Ht 5\' 1"  (1.549 m)  Wt 130 lb (58.968 kg)  BMI 24.58 kg/m2  SpO2 100% Physical Exam  Constitutional: She is oriented to person, place, and time. She appears well-developed and well-nourished.  Non-toxic appearance.  HENT:  Head: Normocephalic.  Right Ear: Tympanic membrane and external ear normal.  Left Ear: Tympanic membrane and external ear normal.  Nasal congestion present.  Eyes: EOM and lids are normal. Pupils are equal, round, and reactive to light.  Neck: Normal range of motion. Neck supple. Carotid bruit is not present.  Cardiovascular: Normal rate, regular rhythm, normal heart sounds, intact distal pulses and normal pulses.  Pulmonary/Chest: Breath sounds normal. No respiratory distress.  Course breath sounds, with occasional wheeze and scattered rhonchi present. There is symmetrical rise and fall of the chest. The patient speaks in complete sentences without problem.  Abdominal: Soft. Bowel sounds are normal. There is no tenderness. There is no guarding.  Musculoskeletal: Normal range of motion.  Lymphadenopathy:       Head (right side): No submandibular adenopathy present.       Head (left side): No submandibular adenopathy present.    She has no  cervical adenopathy.  Neurological: She is alert and oriented to person, place, and time. She has normal strength. No cranial nerve deficit or sensory deficit.  Skin: Skin is warm and dry. No rash noted.  Psychiatric: She has a normal mood and affect. Her speech is normal.  Nursing note and vitals reviewed.   ED Course  Procedures (including critical care time) Labs Review Labs Reviewed - No data to display  Imaging Review No results found. I have personally reviewed and evaluated these images and lab results as part of my medical decision-making.   EKG Interpretation None      MDM  Vital signs were within normal limits. Pulse oximetry is 100% on room air. Within normal limits per my interpretation. The patient speaks in complete sentences without problem. The chest x-ray suggests bronchitis. No pneumonia noted. The patient will be treated with albuterol, Hycodan, prednisone, and I have asked her to use Tylenol and ibuprofen for soreness. discussed with the patient the need to stop smoking.    Final diagnoses:  None    **I have reviewed nursing notes, vital signs, and all appropriate lab and imaging results for this patient.Lily Kocher, PA-C 08/04/15 2222  Forde Dandy, MD 08/05/15 [phone]

## 2015-08-04 NOTE — Discharge Instructions (Signed)
Your chest x-ray shows a bronchitis. No evidence of pneumonia present. Please used 2 puffs of albuterol every 4 hours. Please use the prednisone taper as suggested. Use Tylenol every 4 hours, or ibuprofen every 6 hours for fever or aching. Use Hycodan for cough. This medication may cause drowsiness, please do not drive, drink, operate machinery, participated in activities requiring concentration when taking this medication. Please stop smoking. Upper Respiratory Infection, Adult Most upper respiratory infections (URIs) are caused by a virus. A URI affects the nose, throat, and upper air passages. The most common type of URI is often called "the common cold." HOME CARE   Take medicines only as told by your doctor.  Gargle warm saltwater or take cough drops to comfort your throat as told by your doctor.  Use a warm mist humidifier or inhale steam from a shower to increase air moisture. This may make it easier to breathe.  Drink enough fluid to keep your pee (urine) clear or pale yellow.  Eat soups and other clear broths.  Have a healthy diet.  Rest as needed.  Go back to work when your fever is gone or your doctor says it is okay.  You may need to stay home longer to avoid giving your URI to others.  You can also wear a face mask and wash your hands often to prevent spread of the virus.  Use your inhaler more if you have asthma.  Do not use any tobacco products, including cigarettes, chewing tobacco, or electronic cigarettes. If you need help quitting, ask your doctor. GET HELP IF:  You are getting worse, not better.  Your symptoms are not helped by medicine.  You have chills.  You are getting more short of breath.  You have brown or red mucus.  You have yellow or brown discharge from your nose.  You have pain in your face, especially when you bend forward.  You have a fever.  You have puffy (swollen) neck glands.  You have pain while swallowing.  You have white areas  in the back of your throat. GET HELP RIGHT AWAY IF:   You have very bad or constant:  Headache.  Ear pain.  Pain in your forehead, behind your eyes, and over your cheekbones (sinus pain).  Chest pain.  You have long-lasting (chronic) lung disease and any of the following:  Wheezing.  Long-lasting cough.  Coughing up blood.  A change in your usual mucus.  You have a stiff neck.  You have changes in your:  Vision.  Hearing.  Thinking.  Mood. MAKE SURE YOU:   Understand these instructions.  Will watch your condition.  Will get help right away if you are not doing well or get worse.   This information is not intended to replace advice given to you by your health care provider. Make sure you discuss any questions you have with your health care provider.   Document Released: 02/25/2008 Document Revised: 01/23/2015 Document Reviewed: 12/14/2013 Elsevier Interactive Patient Education [employer].

## 2015-08-04 NOTE — ED Notes (Signed)
Discharge instructions and inhaler given to pt - verbalized understanding.

## 2017-01-26 ENCOUNTER — Encounter (HOSPITAL_COMMUNITY): Payer: Self-pay | Admitting: *Deleted

## 2017-01-26 ENCOUNTER — Emergency Department (HOSPITAL_COMMUNITY)
Admission: EM | Admit: 2017-01-26 | Discharge: 2017-01-26 | Disposition: A | Discharge status: Home / Self Care | Payer: Self-pay | Attending: Emergency Medicine | Admitting: Emergency Medicine

## 2017-01-26 ENCOUNTER — Emergency Department (HOSPITAL_COMMUNITY): Payer: Self-pay

## 2017-01-26 DIAGNOSIS — Z79899 Other long term (current) drug therapy: Secondary | ICD-10-CM | POA: Insufficient documentation

## 2017-01-26 DIAGNOSIS — F17210 Nicotine dependence, cigarettes, uncomplicated: Secondary | ICD-10-CM | POA: Insufficient documentation

## 2017-01-26 DIAGNOSIS — Z8541 Personal history of malignant neoplasm of cervix uteri: Secondary | ICD-10-CM | POA: Insufficient documentation

## 2017-01-26 DIAGNOSIS — R0789 Other chest pain: Secondary | ICD-10-CM

## 2017-01-26 DIAGNOSIS — F419 Anxiety disorder, unspecified: Secondary | ICD-10-CM | POA: Insufficient documentation

## 2017-01-26 DIAGNOSIS — F410 Panic disorder [episodic paroxysmal anxiety] without agoraphobia: Secondary | ICD-10-CM

## 2017-01-26 LAB — RAPID URINE DRUG SCREEN, HOSP PERFORMED
AMPHETAMINES: NOT DETECTED
BARBITURATES: NOT DETECTED
BENZODIAZEPINES: NOT DETECTED
COCAINE: NOT DETECTED
Opiates: POSITIVE — AB
Tetrahydrocannabinol: NOT DETECTED

## 2017-01-26 LAB — CBC WITH DIFFERENTIAL/PLATELET
Basophils Absolute: 0.1 10*3/uL (ref 0.0–0.1)
Basophils Relative: 1 %
EOS ABS: 0.5 10*3/uL (ref 0.0–0.7)
EOS PCT: 6 %
HCT: 40.1 % (ref 36.0–46.0)
HEMOGLOBIN: 13.2 g/dL (ref 12.0–15.0)
Lymphocytes Relative: 25 %
Lymphs Abs: 2.1 10*3/uL (ref 0.7–4.0)
MCH: 28.1 pg (ref 26.0–34.0)
MCHC: 32.9 g/dL (ref 30.0–36.0)
MCV: 85.3 fL (ref 78.0–100.0)
Monocytes Absolute: 0.6 10*3/uL (ref 0.1–1.0)
Monocytes Relative: 7 %
NEUTROS PCT: 61 %
Neutro Abs: 5.3 10*3/uL (ref 1.7–7.7)
PLATELETS: 212 10*3/uL (ref 150–400)
RBC: 4.7 MIL/uL (ref 3.87–5.11)
RDW: 13.5 % (ref 11.5–15.5)
WBC: 8.5 10*3/uL (ref 4.0–10.5)

## 2017-01-26 LAB — URINALYSIS, ROUTINE W REFLEX MICROSCOPIC
Bilirubin Urine: NEGATIVE
Glucose, UA: NEGATIVE mg/dL
Hgb urine dipstick: NEGATIVE
KETONES UR: NEGATIVE mg/dL
LEUKOCYTES UA: NEGATIVE
Nitrite: NEGATIVE
PH: 6 (ref 5.0–8.0)
PROTEIN: NEGATIVE mg/dL
Specific Gravity, Urine: 1.002 — ABNORMAL LOW (ref 1.005–1.030)

## 2017-01-26 LAB — BASIC METABOLIC PANEL
Anion gap: 5 (ref 5–15)
BUN: 11 mg/dL (ref 6–20)
CHLORIDE: 103 mmol/L (ref 101–111)
CO2: 28 mmol/L (ref 22–32)
Calcium: 8.9 mg/dL (ref 8.9–10.3)
Creatinine, Ser: 0.68 mg/dL (ref 0.44–1.00)
GFR calc Af Amer: >60 mL/min (ref >60)
GFR calc non Af Amer: >60 mL/min (ref >60)
GLUCOSE: 84 mg/dL (ref 65–99)
POTASSIUM: 3.7 mmol/L (ref 3.5–5.1)
SODIUM: 136 mmol/L (ref 135–145)

## 2017-01-26 LAB — TROPONIN I: Troponin I: <0.03 ng/mL (ref <0.03)

## 2017-01-26 LAB — PREGNANCY, URINE: PREG TEST UR: NEGATIVE

## 2017-01-26 MED ORDER — LORAZEPAM 1 MG PO TABS
1.0000 mg | ORAL_TABLET | Freq: Once | ORAL | Status: AC
  Administered 2017-01-26: 1 mg via ORAL
  Filled 2017-01-26: qty 1

## 2017-01-26 MED ORDER — HYDROXYZINE PAMOATE 25 MG PO CAPS: 25.0000 mg | ORAL_CAPSULE | Freq: Three times a day (TID) | ORAL | 0 refills | Status: DC | PRN

## 2017-01-26 NOTE — Discharge Instructions (Signed)
There is no evidence of heart attack. Follow-up with your regular doctor. Return to the ED if you develop new or worsening symptoms.

## 2017-01-26 NOTE — ED Triage Notes (Signed)
Pt c/o chest pain and shaky feeling that started around 3am; pt states she has panic attacks
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
## 2017-06-11 ENCOUNTER — Emergency Department (HOSPITAL_COMMUNITY): Payer: Self-pay

## 2017-06-11 ENCOUNTER — Emergency Department (HOSPITAL_COMMUNITY)
Admission: EM | Admit: 2017-06-11 | Discharge: 2017-06-11 | Disposition: A | Discharge status: Home / Self Care | Payer: Self-pay | Attending: Emergency Medicine | Admitting: Emergency Medicine

## 2017-06-11 ENCOUNTER — Encounter (HOSPITAL_COMMUNITY): Payer: Self-pay | Admitting: Emergency Medicine

## 2017-06-11 DIAGNOSIS — Y9383 Activity, rough housing and horseplay: Secondary | ICD-10-CM | POA: Insufficient documentation

## 2017-06-11 DIAGNOSIS — W04XXXA Fall while being carried or supported by other persons, initial encounter: Secondary | ICD-10-CM | POA: Insufficient documentation

## 2017-06-11 DIAGNOSIS — S2232XA Fracture of one rib, left side, initial encounter for closed fracture: Secondary | ICD-10-CM | POA: Insufficient documentation

## 2017-06-11 DIAGNOSIS — Y998 Other external cause status: Secondary | ICD-10-CM | POA: Insufficient documentation

## 2017-06-11 DIAGNOSIS — Y929 Unspecified place or not applicable: Secondary | ICD-10-CM | POA: Insufficient documentation

## 2017-06-11 DIAGNOSIS — F17210 Nicotine dependence, cigarettes, uncomplicated: Secondary | ICD-10-CM | POA: Insufficient documentation

## 2017-06-11 MED ORDER — IBUPROFEN 800 MG PO TABS: 800.0000 mg | ORAL_TABLET | Freq: Three times a day (TID) | ORAL | 0 refills | Status: AC | PRN

## 2017-06-11 MED ORDER — HYDROCODONE-ACETAMINOPHEN 5-325 MG PO TABS: 1.0000 | ORAL_TABLET | Freq: Four times a day (QID) | ORAL | 0 refills | Status: AC | PRN

## 2017-06-11 NOTE — ED Notes (Signed)
Rib pain to the left side of the abdomen
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
## 2017-06-11 NOTE — ED Triage Notes (Signed)
Pain to lt rib area after falling x 2 days ago.  Hurts with movement and coughing

## 2017-06-11 NOTE — ED Notes (Signed)
Pt given discharge instructions regarding incentive spirometer. Pt verbalized understanding.

## 2017-06-11 NOTE — Discharge Instructions (Signed)
Your workup today showed that you have a fracture to one or more ribs.  Unfortunately this type of injury hurts but there is no way to fix it immediately; it must heal over time.  Be sure to take plenty of deep breaths so that you get rid of the "bad air" in your lungs.  If you are given a device called an incentive spirometer, please use it as recommended.  Unless you have been told by your doctor not to do so, we recommend you take ibuprofen 600 mg 3 times daily with meals for no more than 5 days.  You can also take Tylenol 1000 mg every 6 hours for pain.  Follow-up at the clinics or with the doctors described in this paperwork.  Return to the emergency department if he develop new or worsening symptoms that concern you.   Rib Fracture A rib fracture is a break or crack in one of the bones of the ribs. The ribs are a group of [place], curved bones that wrap around your chest and attach to your spine. They protect your lungs and other organs in the chest cavity. A broken or cracked rib is often painful, but most do not cause other problems. Most rib fractures heal on their own over time. However, rib fractures can be more serious if multiple ribs are broken or if broken ribs move out of place and push against other structures. CAUSES  A direct blow to the chest. For example, this could happen during contact sports, a car accident, or a fall against a hard object. Repetitive movements with high force, such as pitching a baseball or having severe coughing spells. SYMPTOMS  Pain when you breathe in or cough. Pain when someone presses on the injured area. DIAGNOSIS  Your caregiver will perform a physical exam. Various imaging tests may be ordered to confirm the diagnosis and to look for related injuries. These tests may include a chest X-ray, computed tomography (CT), magnetic resonance imaging (MRI), or a bone scan. TREATMENT  Rib fractures usually heal on their own in 1-3 months. The longer healing  period is often associated with a continued cough or other aggravating activities. During the healing period, pain control is very important. Medication is usually given to control pain. Hospitalization or surgery may be needed for more severe injuries, such as those in which multiple ribs are broken or the ribs have moved out of place.  HOME CARE INSTRUCTIONS  Avoid strenuous activity and any activities or movements that cause pain. Be careful during activities and avoid bumping the injured rib. Gradually increase activity as directed by your caregiver. Only take over-the-counter or prescription medications as directed by your caregiver. Do not take other medications without asking your caregiver first. Apply ice to the injured area for the first 1-2 days after you have been treated or as directed by your caregiver. Applying ice helps to reduce inflammation and pain. Put ice in a plastic bag. Place a towel between your skin and the bag.   Leave the ice on for 15-20 minutes at a time, every 2 hours while you are awake. Perform deep breathing as directed by your caregiver. This will help prevent pneumonia, which is a common complication of a broken rib. Your caregiver may instruct you to: Take deep breaths several times a day. Try to cough several times a day, holding a pillow against the injured area. Use a device called an incentive spirometer to practice deep breathing several times a day. Drink   enough fluids to keep your urine clear or pale yellow. This will help you avoid constipation.   Do not wear a rib belt or binder. These restrict breathing, which can lead to pneumonia.   SEEK IMMEDIATE MEDICAL CARE IF:  You have a fever.   You have difficulty breathing or shortness of breath.   You develop a continual cough, or you cough up thick or bloody sputum. You feel sick to your stomach (nausea), throw up (vomit), or have abdominal pain.   You have worsening pain not controlled with medications.    MAKE SURE YOU: Understand these instructions. Will watch your condition. Will get help right away if you are not doing well or get worse. Document Released: 09/08/2005 Document Revised: 05/11/2013 Document Reviewed: 11/10/2012 [employer] Patient Information 2015 [employer]. This information is not intended to replace advice given to you by your health care provider. Make sure you discuss any questions you have with your health care provider.   

## 2018-10-14 IMAGING — DX DG CHEST 2V
3 series · 3 of 3 positions shown · non-contrast
Comparison: Chest radiograph December 06, 2016

CLINICAL DATA: Chest pain, shaky feeling beginning at 3 a.m.
History of cervical cancer.

EXAM:
CHEST  2 VIEW

[chest pa]
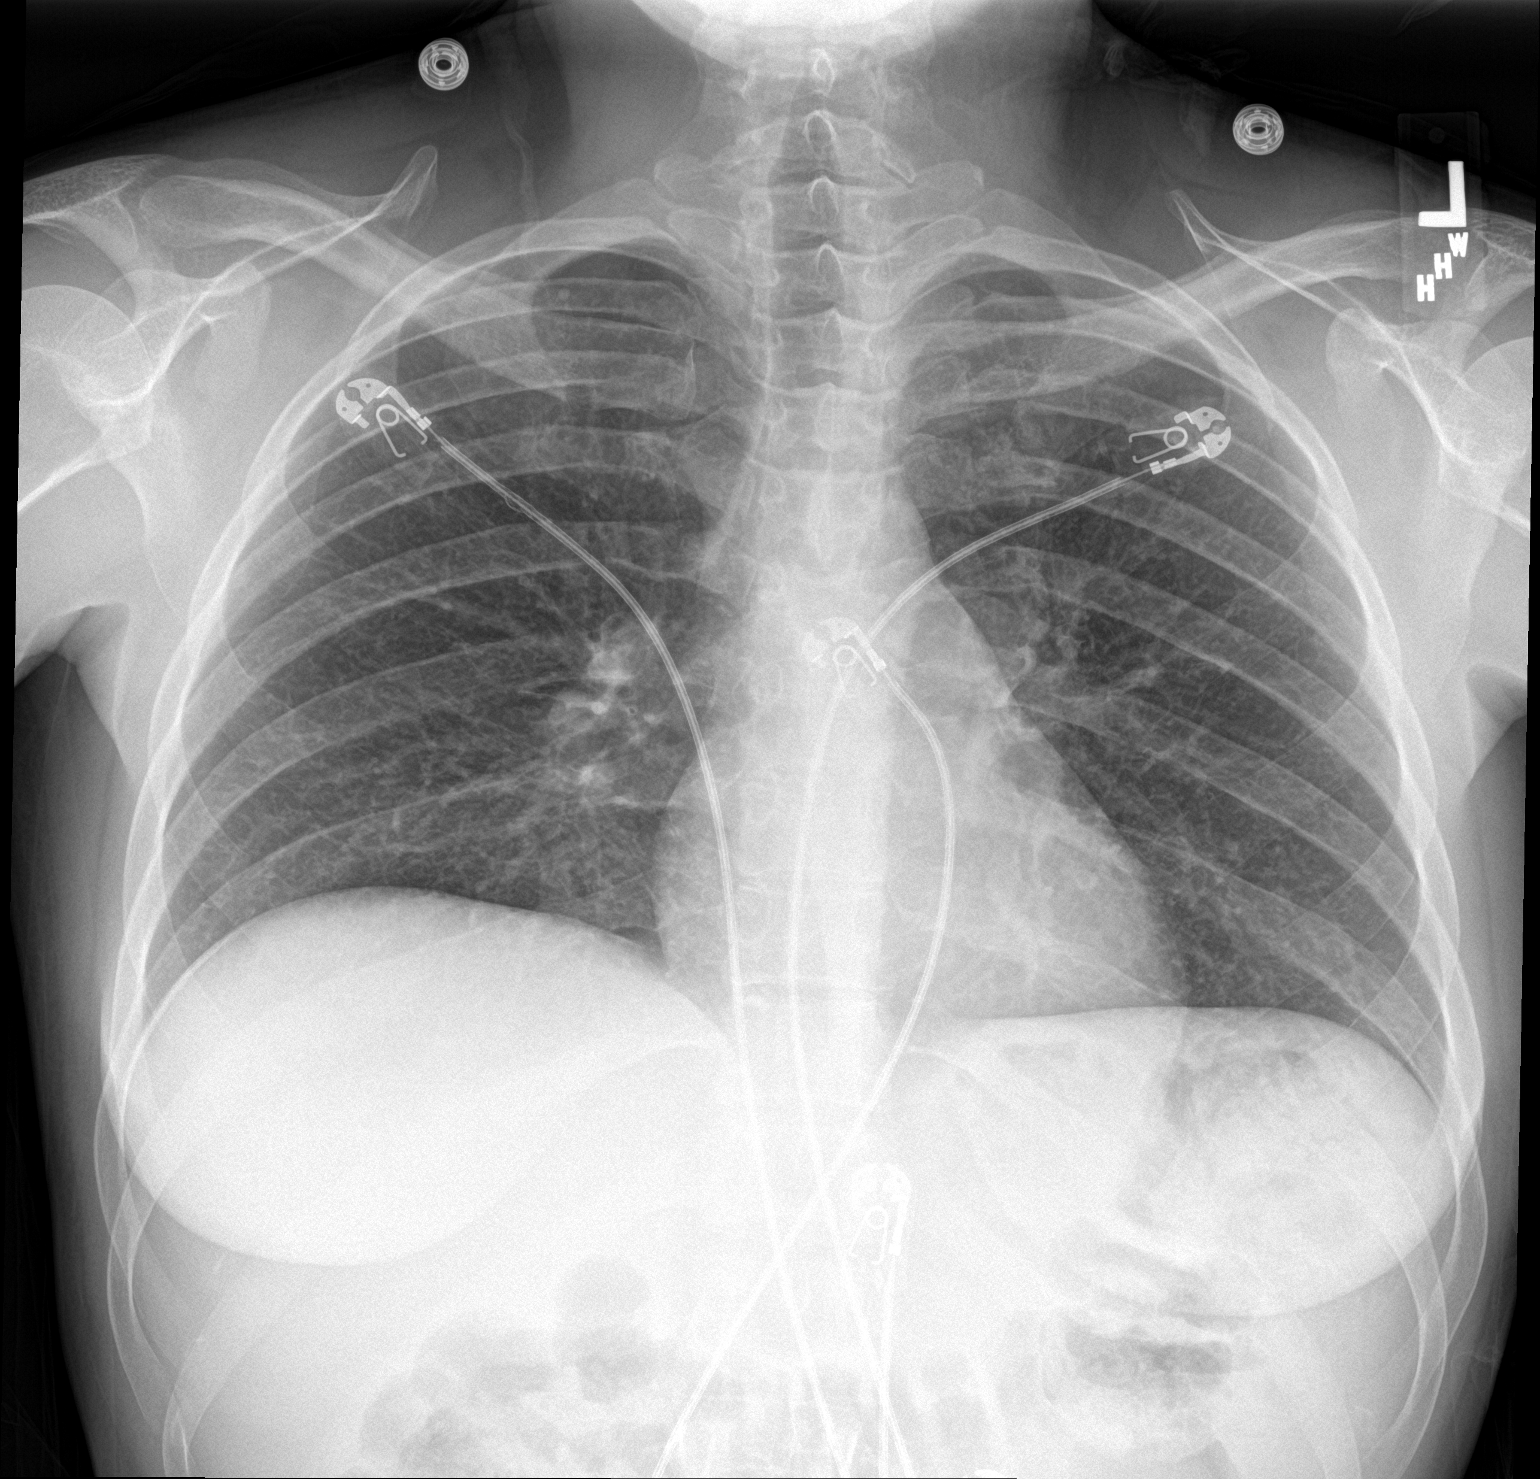

[chest lat (1 of 2)]
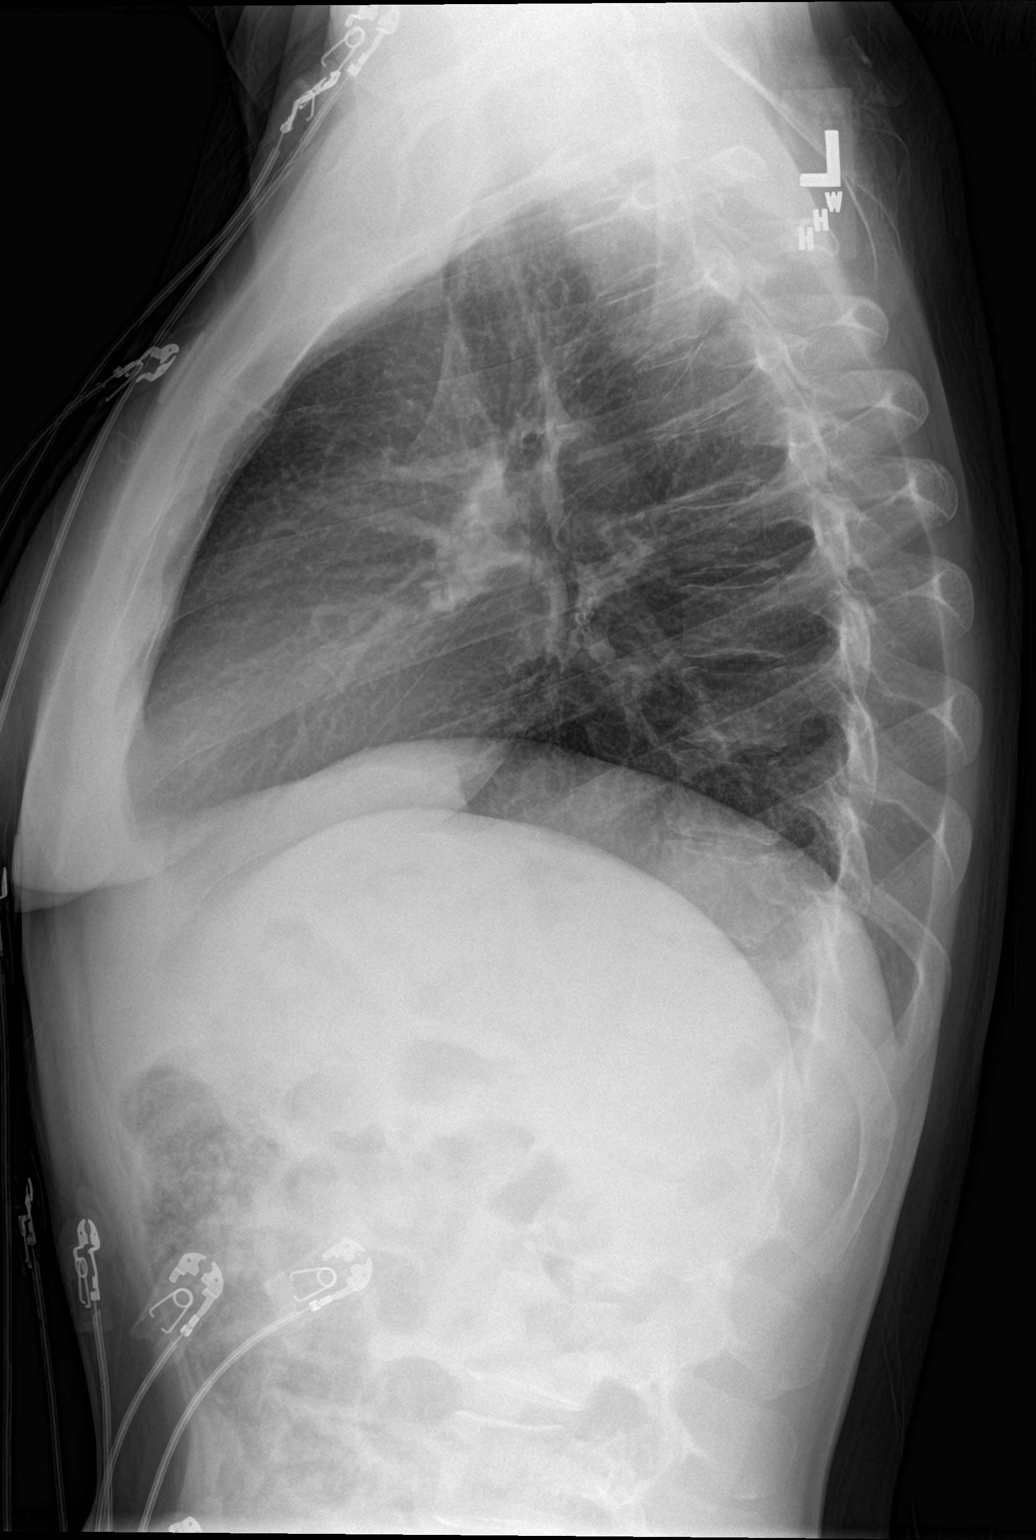

[chest lat (2 of 2)]
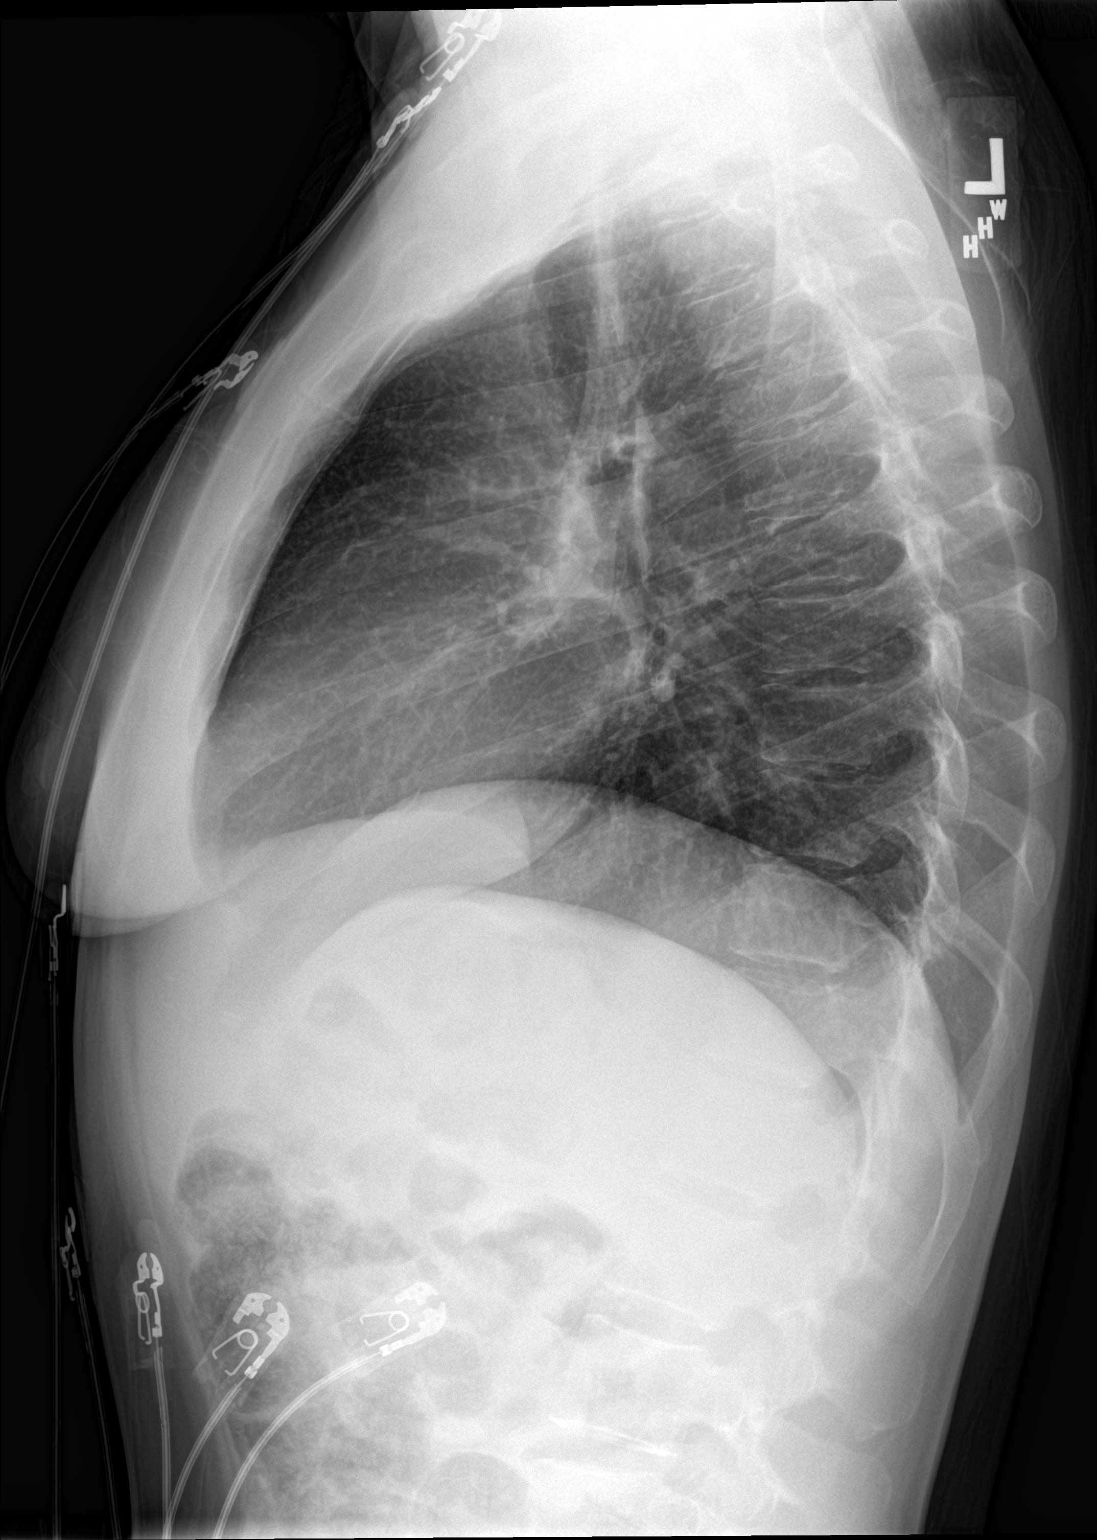

[3 of 3 positions shown; findings below may reference images not displayed]

FINDINGS: Cardiomediastinal silhouette is normal. No pleural effusions or
focal consolidations. Mild bronchitic changes. Trachea projects
midline and there is no pneumothorax. Soft tissue planes and
included osseous structures are non-suspicious.
IMPRESSION: Mild bronchitic changes without focal consolidation.

## 2022-12-18 ENCOUNTER — Telehealth: Payer: Self-pay

## 2022-12-18 NOTE — Telephone Encounter (Signed)
Telephoned patient at [phone]. Left a voice message with [facility] contact information.

## 2022-12-23 ENCOUNTER — Ambulatory Visit: Payer: Self-pay | Admitting: Hematology and Oncology

## 2022-12-23 ENCOUNTER — Other Ambulatory Visit (HOSPITAL_COMMUNITY)
Admission: RE | Admit: 2022-12-23 | Discharge: 2022-12-23 | Disposition: A | Discharge status: Home / Self Care | Payer: Self-pay | Source: Ambulatory Visit | Attending: Obstetrics and Gynecology | Admitting: Obstetrics and Gynecology

## 2022-12-23 VITALS — Wt 157.0 lb

## 2022-12-23 DIAGNOSIS — Z01419 Encounter for gynecological examination (general) (routine) without abnormal findings: Secondary | ICD-10-CM

## 2022-12-23 DIAGNOSIS — N6452 Nipple discharge: Secondary | ICD-10-CM

## 2022-12-23 NOTE — Progress Notes (Signed)
[name] is a 42 y.o. G3P3000 female who presents to [facility] clinic today with complaint of left breast lump.    Pap Smear: Pap smear completed today. Last Pap smear was 3-5 years ago and results are unknown. Per patient has history of an abnormal Pap smear. Abnormal Pap smear requiring cone biopsy; unaware of results. Last Pap smear result is not available in Epic.   Physical exam: Breasts Breasts symmetrical. No skin abnormalities bilateral breasts. No nipple retraction bilateral breasts. Nipple discharge bilateral breasts. No lymphadenopathy. No lumps palpated bilateral breasts.        Pelvic/Bimanual Ext Genitalia No lesions, no swelling and no discharge observed on external genitalia.        Vagina Vagina pink and normal texture. No lesions or discharge observed in vagina.        Cervix Cervix is present. Cervix pink and of normal texture. No discharge observed.    Uterus Uterus is present and palpable. Uterus in normal position and normal size.        Adnexae Bilateral ovaries present and palpable. No tenderness on palpation.         Rectovaginal No rectal exam completed today since patient had no rectal complaints. No skin abnormalities observed on exam.     Smoking History: Patient has is a former smoker and was not referred to quit line. Refer for CT lung low dose screening.   Patient Navigation: Patient education provided. Access to services provided for patient through [facility] program. No interpreter provided. No transportation provided   Colorectal Cancer Screening: Per patient has never had colonoscopy completed No complaints today.    Breast and Cervical Cancer Risk Assessment: Patient has family history of breast cancer with her paternal grandmother and two paternal aunts. Patient has history of cervical dysplasia, immunocompromised, or DES exposure in-utero.  Risk Scores as of 12/23/2022     Baker Janus           5-year 0.33 %   Lifetime 6.73 %             Last calculated by Claretha Cooper, CMA on 12/23/2022 at  9:26 AM        A: [facility] exam with pap smear Complaint of left breast mass with benign exam.   P: [facility] Appointment scheduled 12/23/22.  Melodye Ped, NP 12/23/2022 9:46 AM

## 2022-12-23 NOTE — Patient Instructions (Addendum)
Taught Benedict Needy about self breast awareness and gave educational materials to take home. Patient did need a Pap smear today due to last Pap smear was over 5 years ago per patient. Let her know [facility] will cover Pap smears every 5 years unless has a history of abnormal Pap smears. Referred patient to the [facility] for diagnostic mammogram. Appointment scheduled for 12/23/22. Patient aware of appointment and will be there. Let patient know will follow up with her within the next couple weeks with results. Benedict Needy verbalized understanding.  Melodye Ped, NP 9:26 AM

## 2022-12-24 LAB — CYTOLOGY - PAP
Adequacy: ABSENT
Comment: NEGATIVE
Diagnosis: NEGATIVE
High risk HPV: NEGATIVE

## 2022-12-25 ENCOUNTER — Encounter: Payer: Self-pay | Admitting: Hematology and Oncology

## 2022-12-25 LAB — CYTOLOGY - NON PAP

## 2022-12-25 NOTE — Progress Notes (Signed)
Lung screening referral has been sent for this pt. She stated she smoked 2 packs of cigarettes and 5 cigars daily.

## 2023-01-04 ENCOUNTER — Emergency Department (HOSPITAL_COMMUNITY)
Admission: EM | Admit: 2023-01-04 | Discharge: 2023-01-04 | Disposition: A | Discharge status: Home / Self Care | Payer: Self-pay | Attending: Emergency Medicine | Admitting: Emergency Medicine

## 2023-01-04 ENCOUNTER — Emergency Department (HOSPITAL_COMMUNITY): Payer: Self-pay

## 2023-01-04 DIAGNOSIS — R0789 Other chest pain: Secondary | ICD-10-CM | POA: Insufficient documentation

## 2023-01-04 MED ORDER — IBUPROFEN 600 MG PO TABS: 600.0000 mg | ORAL_TABLET | Freq: Four times a day (QID) | ORAL | 0 refills | Status: AC | PRN

## 2023-01-04 NOTE — ED Provider Notes (Signed)
[place] EMERGENCY DEPARTMENT AT [facility] Provider Note   CSN: [id number] Arrival date & time: 01/04/23  1528     History  No chief complaint on file.   Jenny Garrison is a 42 y.o. female.  42 year old female presents with sudden onset of right-sided chest pain which began when she went to stand up.  States she felt like there was a pop on the right side of her chest.  Pain characterized as sharp and worse with movement.  Some dyspnea.  Denies hemoptysis.  Was feeling at her baseline prior to this.  States pain is better when she remains still and worse with movement.  Called EMS and was transported here       Home Medications Prior to Admission medications   Medication Sig Start Date End Date Taking? Authorizing Provider  acetaminophen (TYLENOL) 500 MG tablet Take 1,000 mg by mouth every 4 (four) hours as needed for moderate pain. Patient not taking: Reported on 12/23/2022    [provider]  aspirin-acetaminophen-caffeine (EXCEDRIN MIGRAINE) [phone] MG tablet Take 2 tablets by mouth every 6 (six) hours as needed for headache or migraine. Patient not taking: Reported on 12/23/2022    [provider]  HYDROcodone-acetaminophen (NORCO/VICODIN) 5-325 MG tablet Take 1 tablet by mouth every 6 (six) hours as needed for severe pain. Patient not taking: Reported on 12/23/2022 06/11/17   Long, Arlyss Repress, MD  ibuprofen (ADVIL,MOTRIN) 800 MG tablet Take 1 tablet (800 mg total) by mouth every 8 (eight) hours as needed. Patient not taking: Reported on 12/23/2022 06/11/17   Maia Plan, MD  meclizine (ANTIVERT) 25 MG tablet Take 25 mg by mouth daily as needed for dizziness. Patient not taking: Reported on 12/23/2022    [provider]      Allergies    Patient has no known allergies.    Review of Systems   Review of Systems  All other systems reviewed and are negative.   Physical Exam Updated Vital Signs LMP 12/19/2022 (Approximate)  Physical  Exam Vitals and nursing note reviewed.  Constitutional:      General: She is not in acute distress.    Appearance: Normal appearance. She is well-developed. She is not toxic-appearing.  HENT:     Head: Normocephalic and atraumatic.  Eyes:     General: Lids are normal.     Conjunctiva/sclera: Conjunctivae normal.     Pupils: Pupils are equal, round, and reactive to light.  Neck:     Thyroid: No thyroid mass.     Trachea: No tracheal deviation.  Cardiovascular:     Rate and Rhythm: Normal rate and regular rhythm.     Heart sounds: Normal heart sounds. No murmur heard.    No gallop.  Pulmonary:     Effort: Pulmonary effort is normal. No respiratory distress.     Breath sounds: Normal breath sounds. No stridor. No decreased breath sounds, wheezing, rhonchi or rales.  Chest:     Chest wall: Tenderness present. No crepitus.    Abdominal:     General: There is no distension.     Palpations: Abdomen is soft.     Tenderness: There is no abdominal tenderness. There is no rebound.  Musculoskeletal:        General: No tenderness. Normal range of motion.     Cervical back: Normal range of motion and neck supple.  Skin:    General: Skin is warm and dry.     Findings: No abrasion or  rash.  Neurological:     Mental Status: She is alert and oriented to person, place, and time. Mental status is at baseline.     GCS: GCS eye subscore is 4. GCS verbal subscore is 5. GCS motor subscore is 6.     Cranial Nerves: No cranial nerve deficit.     Sensory: No sensory deficit.     Motor: Motor function is intact.  Psychiatric:        Attention and Perception: Attention normal.        Speech: Speech normal.        Behavior: Behavior normal.     ED Results / Procedures / Treatments   Labs (all labs ordered are listed, but only abnormal results are displayed) Labs Reviewed - No data to display  EKG None  Radiology No results found.  Procedures Procedures    Medications Ordered in  ED Medications - No data to display  ED Course/ Medical Decision Making/ A&P                             Medical Decision Making Amount and/or Complexity of Data Reviewed Radiology: ordered. ECG/medicine tests: ordered.   Patient had acute onset of right-sided sharp anterior chest pain that began when she stood up.  Pain is reproducible to palpation.  Patient is EKG per interpretation shows no signs of acute ischemic changes.  Patient's chest x-ray per my interpretation shows no acute findings.  Suspect muscle skeletal etiology.  No suspicion for ACS or PE.  Will discharge home        Final Clinical Impression(s) / ED Diagnoses Final diagnoses:  None    Rx / DC Orders ED Discharge Orders     None         Lorre Nick, MD 01/04/23 1718

## 2023-01-04 NOTE — ED Triage Notes (Signed)
Pt bib ems from home c/o right side chest pain with radiation towards the center. Pt was pale, clammy, and dizzy when symptoms started around 11 am. EMS gave pt 324 mg aspirin which resolved symptoms. Pt pain initially 10/10 and currently 4/10. Orthostatic negative and NSR EKG  BP 148/83 RA 99% HR 70 CBG 110

## 2023-01-19 ENCOUNTER — Telehealth: Payer: Self-pay

## 2023-01-19 NOTE — Telephone Encounter (Signed)
I spoke with pt and advised the results from her nipple discharge sample was negative for malignancy. Pt expressed understanding of this information.

## 2023-05-07 ENCOUNTER — Emergency Department (HOSPITAL_COMMUNITY): Payer: Self-pay

## 2023-05-07 ENCOUNTER — Encounter (HOSPITAL_COMMUNITY): Payer: Self-pay

## 2023-05-07 ENCOUNTER — Other Ambulatory Visit: Payer: Self-pay

## 2023-05-07 ENCOUNTER — Emergency Department (HOSPITAL_COMMUNITY)
Admission: EM | Admit: 2023-05-07 | Discharge: 2023-05-08 | Disposition: A | Discharge status: Home / Self Care | Payer: Self-pay | Attending: Emergency Medicine | Admitting: Emergency Medicine

## 2023-05-07 DIAGNOSIS — R519 Headache, unspecified: Secondary | ICD-10-CM | POA: Insufficient documentation

## 2023-05-07 DIAGNOSIS — R079 Chest pain, unspecified: Secondary | ICD-10-CM | POA: Insufficient documentation

## 2023-05-07 DIAGNOSIS — M4802 Spinal stenosis, cervical region: Secondary | ICD-10-CM | POA: Insufficient documentation

## 2023-05-07 DIAGNOSIS — M5020 Other cervical disc displacement, unspecified cervical region: Secondary | ICD-10-CM

## 2023-05-07 DIAGNOSIS — R202 Paresthesia of skin: Secondary | ICD-10-CM

## 2023-05-07 DIAGNOSIS — R42 Dizziness and giddiness: Secondary | ICD-10-CM | POA: Insufficient documentation

## 2023-05-07 LAB — CBC WITH DIFFERENTIAL/PLATELET
Abs Immature Granulocytes: 0.02 10*3/uL (ref 0.00–0.07)
Basophils Absolute: 0.1 10*3/uL (ref 0.0–0.1)
Basophils Relative: 1 %
Eosinophils Absolute: 0.5 10*3/uL (ref 0.0–0.5)
Eosinophils Relative: 6 %
HCT: 39.4 % (ref 36.0–46.0)
Hemoglobin: 11.8 g/dL — ABNORMAL LOW (ref 12.0–15.0)
Immature Granulocytes: 0 %
Lymphocytes Relative: 21 %
Lymphs Abs: 1.7 10*3/uL (ref 0.7–4.0)
MCH: 22.1 pg — ABNORMAL LOW (ref 26.0–34.0)
MCHC: 29.9 g/dL — ABNORMAL LOW (ref 30.0–36.0)
MCV: 73.6 fL — ABNORMAL LOW (ref 80.0–100.0)
Monocytes Absolute: 0.5 10*3/uL (ref 0.1–1.0)
Monocytes Relative: 6 %
Neutro Abs: 5.3 10*3/uL (ref 1.7–7.7)
Neutrophils Relative %: 66 %
Platelets: 326 10*3/uL (ref 150–400)
RBC: 5.35 MIL/uL — ABNORMAL HIGH (ref 3.87–5.11)
RDW: 16.1 % — ABNORMAL HIGH (ref 11.5–15.5)
WBC: 8.1 10*3/uL (ref 4.0–10.5)
nRBC: 0 % (ref 0.0–0.2)

## 2023-05-07 LAB — TROPONIN I (HIGH SENSITIVITY)
Troponin I (High Sensitivity): 2 ng/L (ref <18)
Troponin I (High Sensitivity): <2 ng/L (ref <18)

## 2023-05-07 LAB — COMPREHENSIVE METABOLIC PANEL
ALT: 50 U/L — ABNORMAL HIGH (ref 0–44)
AST: 40 U/L (ref 15–41)
Albumin: 3.6 g/dL (ref 3.5–5.0)
Alkaline Phosphatase: 94 U/L (ref 38–126)
Anion gap: 9 (ref 5–15)
BUN: 10 mg/dL (ref 6–20)
CO2: 20 mmol/L — ABNORMAL LOW (ref 22–32)
Calcium: 8.7 mg/dL — ABNORMAL LOW (ref 8.9–10.3)
Chloride: 109 mmol/L (ref 98–111)
Creatinine, Ser: 0.74 mg/dL (ref 0.44–1.00)
GFR, Estimated: >60 mL/min (ref >60)
Glucose, Bld: 95 mg/dL (ref 70–99)
Potassium: 3.9 mmol/L (ref 3.5–5.1)
Sodium: 138 mmol/L (ref 135–145)
Total Bilirubin: 0.4 mg/dL (ref 0.3–1.2)
Total Protein: 7.2 g/dL (ref 6.5–8.1)

## 2023-05-07 LAB — CBG MONITORING, ED: Glucose-Capillary: 99 mg/dL (ref 70–99)

## 2023-05-07 MED ORDER — SODIUM CHLORIDE 0.9% FLUSH
3.0000 mL | Freq: Once | INTRAVENOUS | Status: AC
  Administered 2023-05-07: 3 mL via INTRAVENOUS

## 2023-05-07 MED ORDER — ACETAMINOPHEN 325 MG PO TABS
650.0000 mg | ORAL_TABLET | Freq: Once | ORAL | Status: AC
  Administered 2023-05-07: 650 mg via ORAL
  Filled 2023-05-07: qty 2

## 2023-05-07 MED ORDER — ALUM & MAG HYDROXIDE-SIMETH 200-200-20 MG/5ML PO SUSP
30.0000 mL | Freq: Once | ORAL | Status: AC
  Administered 2023-05-07: 30 mL via ORAL
  Filled 2023-05-07: qty 30

## 2023-05-07 MED ORDER — FAMOTIDINE 20 MG PO TABS
20.0000 mg | ORAL_TABLET | Freq: Once | ORAL | Status: AC
  Administered 2023-05-07: 20 mg via ORAL
  Filled 2023-05-07: qty 1

## 2023-05-07 MED ORDER — LORAZEPAM 1 MG PO TABS: 1.0000 mg | ORAL_TABLET | Freq: Once | ORAL | Status: DC | PRN

## 2023-05-07 NOTE — ED Triage Notes (Signed)
Pt arrived via ems to the er for the c/o left leg and arm tingling and chest pain x 4 days. Tingling is constant, chest pain moves from one side to the other. Pt also c/o being lightheaded on and off for 4 days. VSS. No cardiac hx

## 2023-05-07 NOTE — ED Provider Notes (Signed)
[place] EMERGENCY DEPARTMENT AT [facility] Provider Note   CSN: [id number] Arrival date & time: 05/07/23  1456     History {Add pertinent medical, surgical, social history, OB history to HPI:1} Chief Complaint  Patient presents with   Tingling   Chest Pain    Jenny Garrison is a 42 y.o. female.  Who presents emergency department with chief complaint of numbness and tingling of left upper and left lower extremity X 4 days.  Patient also reports intermittent dizziness.  She has never had anything like this before.  She denies any weakness.  She denies injury.  She denies neck pain.  She denies any involvement of the face or thorax.  She denies changes in vision.  She complains of a frontal headache which started today.  She denies changes in vision.  She has been ambulatory.  She denies a history of headaches or migraines.  Chest Pain      Home Medications Prior to Admission medications   Medication Sig Start Date End Date Taking? Authorizing Provider  acetaminophen (TYLENOL) 500 MG tablet Take 1,000 mg by mouth every 4 (four) hours as needed for moderate pain. Patient not taking: Reported on 12/23/2022    [provider]  aspirin-acetaminophen-caffeine (EXCEDRIN MIGRAINE) [phone] MG tablet Take 2 tablets by mouth every 6 (six) hours as needed for headache or migraine. Patient not taking: Reported on 12/23/2022    [provider]  HYDROcodone-acetaminophen (NORCO/VICODIN) 5-325 MG tablet Take 1 tablet by mouth every 6 (six) hours as needed for severe pain. Patient not taking: Reported on 12/23/2022 06/11/17   Long, Arlyss Repress, MD  ibuprofen (ADVIL) 600 MG tablet Take 1 tablet (600 mg total) by mouth every 6 (six) hours as needed. 01/04/23   Lorre Nick, MD  ibuprofen (ADVIL,MOTRIN) 800 MG tablet Take 1 tablet (800 mg total) by mouth every 8 (eight) hours as needed. Patient not taking: Reported on 12/23/2022 06/11/17   Maia Plan, MD  meclizine (ANTIVERT)  25 MG tablet Take 25 mg by mouth daily as needed for dizziness. Patient not taking: Reported on 12/23/2022    [provider]      Allergies    Patient has no known allergies.    Review of Systems   Review of Systems  Cardiovascular:  Positive for chest pain.    Physical Exam Updated Vital Signs BP 121/76   Pulse 69   Temp 98.4 F (36.9 C) (Oral)   Resp 16   Ht 5\' 1"  (1.549 m)   Wt 71.7 kg   SpO2 100%   BMI 29.85 kg/m  Physical Exam Physical Exam  Constitutional: Pt is oriented to person, place, and time. Pt appears well-developed and well-nourished. No distress.  HENT:  Head: Normocephalic and atraumatic.  Mouth/Throat: Oropharynx is clear and moist.  Eyes: Conjunctivae and EOM are normal. Pupils are equal, round, and reactive to light. No scleral icterus.  No horizontal, vertical or rotational nystagmus  Neck: Normal range of motion. Neck supple.  Full active and passive ROM without pain No midline or paraspinal tenderness No nuchal rigidity or meningeal signs  Cardiovascular: Normal rate, regular rhythm and intact distal pulses.   Pulmonary/Chest: Effort normal and breath sounds normal. No respiratory distress. Pt has no wheezes. No rales.  Abdominal: Soft. Bowel sounds are normal. There is no tenderness. There is no rebound and no guarding.  Musculoskeletal: Normal range of motion.  Lymphadenopathy:    No cervical adenopathy.  Neurological: Pt. is  alert and oriented to person, place, and time. He has normal reflexes. No cranial nerve deficit.  Exhibits normal muscle tone. Coordination normal.  Mental Status:  Alert, oriented, thought content appropriate. Speech fluent without evidence of aphasia. Able to follow 2 step commands without difficulty.  Cranial Nerves:  II:  Peripheral visual fields grossly normal, pupils equal, round, reactive to light III,IV, VI: ptosis not present, extra-ocular motions intact bilaterally  V,VII: smile symmetric, facial light  touch sensation equal VIII: hearing grossly normal bilaterally  IX,X: midline uvula rise  XI: bilateral shoulder shrug equal and strong XII: midline tongue extension  Motor:  5/5 in upper and lower extremities bilaterally including strong and equal grip strength and dorsiflexion/plantar flexion Sensory:light touch normal in all extremities.  Patient reports abnormal sensation at rest Deep Tendon Reflexes: 2+ and symmetric  Cerebellar: normal finger-to-nose with bilateral upper extremities Gait: normal gait and balance CV: distal pulses palpable throughout   Skin: Skin is warm and dry. No rash noted. Pt is not diaphoretic.  Psychiatric: Pt has a normal mood and affect. Behavior is normal. Judgment and thought content normal.  Nursing note and vitals reviewed.  ED Results / Procedures / Treatments   Labs (all labs ordered are listed, but only abnormal results are displayed) Labs Reviewed  COMPREHENSIVE METABOLIC PANEL - Abnormal; Notable for the following components:      Result Value   CO2 20 (*)    Calcium 8.7 (*)    ALT 50 (*)    All other components within normal limits  CBC WITH DIFFERENTIAL/PLATELET - Abnormal; Notable for the following components:   RBC 5.35 (*)    Hemoglobin 11.8 (*)    MCV 73.6 (*)    MCH 22.1 (*)    MCHC 29.9 (*)    RDW 16.1 (*)    All other components within normal limits  CBG MONITORING, ED  TROPONIN I (HIGH SENSITIVITY)  TROPONIN I (HIGH SENSITIVITY)    EKG EKG Interpretation Date/Time:  Thursday May 07 2023 15:13:48 EDT Ventricular Rate:  70 PR Interval:  146 QRS Duration:  74 QT Interval:  368 QTC Calculation: 397 R Axis:   37  Text Interpretation: Normal sinus rhythm Normal ECG When compared with ECG of 04-Jan-2023 16:01, No significant change since last tracing Confirmed by Meridee Score [id number]) on 05/07/2023 5:59:32 PM  Radiology CT HEAD WO CONTRAST  Result Date: 05/07/2023 CLINICAL DATA:  Tingling, chest pain EXAM: CT HEAD  WITHOUT CONTRAST TECHNIQUE: Contiguous axial images were obtained from the base of the skull through the vertex without intravenous contrast. RADIATION DOSE REDUCTION: This exam was performed according to the departmental dose-optimization program which includes automated exposure control, adjustment of the mA and/or kV according to patient size and/or use of iterative reconstruction technique. COMPARISON:  CT brain 07/29/2010 FINDINGS: Brain: No acute territorial infarction, hemorrhage, or suspicious intracranial mass. 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 Non enlarged ventricles Vascular: No hyperdense vessel or unexpected calcification. Skull: Normal. Negative for fracture or focal lesion. Sinuses/Orbits: No acute finding. Other: None. IMPRESSION: 1. No CT evidence for acute intracranial abnormality. Electronically Signed   By: Jasmine Pang M.D.   On: 05/07/2023 17:52   DG Chest 2 View  Result Date: 05/07/2023 CLINICAL DATA:  Chest pain EXAM: CHEST - 2 VIEW COMPARISON:  X-ray 01/04/2023 and older FINDINGS: The heart size and mediastinal contours are within normal limits. Both lungs are clear. No consolidation, pneumothorax or effusion. Stable dense right apical  nodule. Likely calcified and related to old granulomatous disease. The visualized skeletal structures are unremarkable. IMPRESSION: No acute cardiopulmonary disease. Electronically Signed   By: Karen Kays M.D.   On: 05/07/2023 17:33    Procedures Procedures  {Document cardiac monitor, telemetry assessment procedure when appropriate:1}  Medications Ordered in ED Medications  LORazepam (ATIVAN) tablet 1 mg (has no administration in time range)  sodium chloride flush (NS) 0.9 % injection 3 mL (3 mLs Intravenous Given 05/07/23 1524)  alum & mag hydroxide-simeth (MAALOX/MYLANTA) 200-200-20 MG/5ML suspension 30 mL (30 mLs Oral Given 05/07/23 1648)    ED Course/ Medical Decision Making/ A&P Clinical Course as of  05/07/23 2038  Thu May 07, 2023  2037 Case discussed with Dr. Derry Lory who recommends MR brain and C-spine without contrast as well as MRV. [AH]    Clinical Course User Index [AH] Arthor Captain, PA-C   {   Click here for ABCD2, HEART and other calculatorsREFRESH Note before signing :1}                              Medical Decision Making Amount and/or Complexity of Data Reviewed Labs: ordered. Radiology: ordered.  Risk OTC drugs. Prescription drug management.   Patient here w  paresthesia .   {Document critical care time when appropriate:1} {Document review of labs and clinical decision tools ie heart score, Chads2Vasc2 etc:1}  {Document your independent review of radiology images, and any outside records:1} {Document your discussion with family members, caretakers, and with consultants:1} {Document social determinants of health affecting pt's care:1} {Document your decision making why or why not admission, treatments were needed:1} Final Clinical Impression(s) / ED Diagnoses Final diagnoses:  None    Rx / DC Orders ED Discharge Orders     None

## 2023-05-07 NOTE — ED Provider Triage Note (Signed)
Emergency Medicine Provider Triage Evaluation Note  SULEY KINCADE , a 42 y.o. female  was evaluated in triage.  Pt complains of diffuse chest pain and epigastric pain which she describes as a burning sensation.  Also complaining of left-sided tingliness in the left upper and lower extremity.  This has been constant for 4 days.  Reports associated intermittent shortness of breath.  Review of Systems  Positive:  Negative: See above   Physical Exam  BP 129/88 (BP Location: Right Arm)   Pulse 66   Temp 98.5 F (36.9 C)   Resp 18   Ht 5\' 1"  (1.549 m)   Wt 71.7 kg   SpO2 100%   BMI 29.85 kg/m  Gen:   Awake, no distress   Resp:  Normal effort  MSK:   Moves extremities without difficulty  Other:  Cranial nerves II to XII are intact.  5/5 strength to the upper and lower extremities.  Normal sensation to the upper and lower extremities.  No dysmetria with finger-to-nose.  Medical Decision Making  Medically screening exam initiated at 3:37 PM.  Appropriate orders placed.  DESTRY BOURDEAU was informed that the remainder of the evaluation will be completed by another provider, this initial triage assessment does not replace that evaluation, and the importance of remaining in the ED until their evaluation is complete.     Honor Loh [place], [place] 05/07/23 1538

## 2023-05-08 NOTE — ED Provider Notes (Signed)
Patient here with both left and sided tingling in her arm and leg.  Symptoms have been going on for the past 4 to 5 days.  Patient signed out to me at shift change pending MRIs.  Neurology had recommended MRI of brain, cervical spine, and MRV.  MRI of brain and MRV are normal.  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  I discussed these findings with the patient.  Have recommended that she follow-up with neurology and neurosurgery.  I have reviewed the rest of her labs and workup.  I do not find any emergent indication to admit the patient to the hospital and feel that she can be safely discharged with outpatient follow-up.   Roxy Horseman, PA-C 05/08/23 0155    Dione Booze, MD 05/08/23 [id number]

## 2024-05-11 ENCOUNTER — Other Ambulatory Visit (HOSPITAL_COMMUNITY): Payer: Self-pay
